# Patient Record
Sex: Male | Born: 1955 | Race: White | Hispanic: No | Marital: Married | Smoking: Former smoker
Health system: Southern US, Community
[De-identification: ages and names within clinical notes are randomized; demographics above are authoritative.]

## PROBLEM LIST (undated history)

## (undated) DIAGNOSIS — C801 Malignant (primary) neoplasm, unspecified: Secondary | ICD-10-CM

## (undated) DIAGNOSIS — M1990 Unspecified osteoarthritis, unspecified site: Secondary | ICD-10-CM

## (undated) DIAGNOSIS — F419 Anxiety disorder, unspecified: Secondary | ICD-10-CM

## (undated) DIAGNOSIS — I10 Essential (primary) hypertension: Secondary | ICD-10-CM

## (undated) DIAGNOSIS — Z8719 Personal history of other diseases of the digestive system: Secondary | ICD-10-CM

## (undated) DIAGNOSIS — R011 Cardiac murmur, unspecified: Secondary | ICD-10-CM

## (undated) DIAGNOSIS — R519 Headache, unspecified: Secondary | ICD-10-CM

## (undated) HISTORY — PX: JOINT REPLACEMENT: SHX530

## (undated) HISTORY — PX: TOTAL HIP REVISION: SHX763

## (undated) HISTORY — PX: TOTAL HIP ARTHROPLASTY: SHX124

---

## 2002-07-09 DIAGNOSIS — I10 Essential (primary) hypertension: Secondary | ICD-10-CM | POA: Insufficient documentation

## 2015-05-08 DIAGNOSIS — R011 Cardiac murmur, unspecified: Secondary | ICD-10-CM | POA: Insufficient documentation

## 2017-09-02 ENCOUNTER — Telehealth: Payer: Self-pay | Admitting: Orthopedic Surgery

## 2017-09-02 NOTE — Telephone Encounter (Signed)
Patient called to inquire about scheduling appointment w/Dr Aline Brochure; states new to the area. Offered appointment.  Patient relays that he has treated with orthopaedist in [place] for this problem.  Discussed requesting of records,films for Dr to review.  Mailed authorization request form, which he will complete and bring in for our office to forward to his previous provider.

## 2017-11-03 ENCOUNTER — Other Ambulatory Visit: Payer: Self-pay

## 2017-11-03 ENCOUNTER — Ambulatory Visit
Admission: RE | Admit: 2017-11-03 | Discharge: 2017-11-03 | Disposition: A | Discharge status: Home / Self Care | Payer: Managed Care, Other (non HMO) | Source: Ambulatory Visit | Attending: Surgery | Admitting: Surgery

## 2017-11-03 ENCOUNTER — Encounter
Admission: RE | Admit: 2017-11-03 | Discharge: 2017-11-03 | Disposition: A | Discharge status: Home / Self Care | Payer: Managed Care, Other (non HMO) | Source: Ambulatory Visit | Attending: Surgery | Admitting: Surgery

## 2017-11-03 DIAGNOSIS — M1612 Unilateral primary osteoarthritis, left hip: Secondary | ICD-10-CM | POA: Insufficient documentation

## 2017-11-03 DIAGNOSIS — Z01812 Encounter for preprocedural laboratory examination: Secondary | ICD-10-CM | POA: Insufficient documentation

## 2017-11-03 DIAGNOSIS — R911 Solitary pulmonary nodule: Secondary | ICD-10-CM | POA: Insufficient documentation

## 2017-11-03 DIAGNOSIS — R9431 Abnormal electrocardiogram [ECG] [EKG]: Secondary | ICD-10-CM | POA: Insufficient documentation

## 2017-11-03 DIAGNOSIS — Z01810 Encounter for preprocedural cardiovascular examination: Secondary | ICD-10-CM | POA: Diagnosis not present

## 2017-11-03 DIAGNOSIS — Z01818 Encounter for other preprocedural examination: Secondary | ICD-10-CM

## 2017-11-03 HISTORY — DX: Cardiac murmur, unspecified: R01.1

## 2017-11-03 HISTORY — DX: Unspecified osteoarthritis, unspecified site: M19.90

## 2017-11-03 HISTORY — DX: Anxiety disorder, unspecified: F41.9

## 2017-11-03 HISTORY — DX: Personal history of other diseases of the digestive system: Z87.19

## 2017-11-03 HISTORY — DX: Essential (primary) hypertension: I10

## 2017-11-03 LAB — CBC
HCT: 42.3 % (ref 40.0–52.0)
HEMOGLOBIN: 14.5 g/dL (ref 13.0–18.0)
MCH: 31.2 pg (ref 26.0–34.0)
MCHC: 34.2 g/dL (ref 32.0–36.0)
MCV: 91.4 fL (ref 80.0–100.0)
Platelets: 211 10*3/uL (ref 150–440)
RBC: 4.63 MIL/uL (ref 4.40–5.90)
RDW: 13.6 % (ref 11.5–14.5)
WBC: 5.2 10*3/uL (ref 3.8–10.6)

## 2017-11-03 LAB — URINALYSIS, COMPLETE (UACMP) WITH MICROSCOPIC
BACTERIA UA: NONE SEEN
Bilirubin Urine: NEGATIVE
Glucose, UA: NEGATIVE mg/dL
Hgb urine dipstick: NEGATIVE
Ketones, ur: NEGATIVE mg/dL
Leukocytes, UA: NEGATIVE
Nitrite: NEGATIVE
PROTEIN: NEGATIVE mg/dL
SQUAMOUS EPITHELIAL / LPF: NONE SEEN
Specific Gravity, Urine: 1.011 (ref 1.005–1.030)
pH: 7 (ref 5.0–8.0)

## 2017-11-03 LAB — BASIC METABOLIC PANEL
Anion gap: 10 (ref 5–15)
BUN: 13 mg/dL (ref 6–20)
CHLORIDE: 105 mmol/L (ref 101–111)
CO2: 26 mmol/L (ref 22–32)
Calcium: 9.1 mg/dL (ref 8.9–10.3)
Creatinine, Ser: 0.77 mg/dL (ref 0.61–1.24)
GFR calc Af Amer: >60 mL/min (ref >60)
GFR calc non Af Amer: >60 mL/min (ref >60)
GLUCOSE: 97 mg/dL (ref 65–99)
POTASSIUM: 3.8 mmol/L (ref 3.5–5.1)
Sodium: 141 mmol/L (ref 135–145)

## 2017-11-03 LAB — SURGICAL PCR SCREEN
MRSA, PCR: NEGATIVE
STAPHYLOCOCCUS AUREUS: NEGATIVE

## 2017-11-03 LAB — TYPE AND SCREEN
ABO/RH(D): A POS
ANTIBODY SCREEN: NEGATIVE

## 2017-11-03 NOTE — Pre-Procedure Instructions (Signed)
AS INSTRUCTED BY DR A PENWARDEN, EKG/REQUEST TO HAVE MEDICAL CLEARANCE FAXED TO DR POGGI. LM FOR TIFFANY. DID NOT SEE PCP IN CARE TEAMS OR RECENT CARDIAC VISIT

## 2017-11-03 NOTE — Patient Instructions (Signed)
Your procedure is scheduled on: 11/18/17 Thur Report to Same Day Surgery 2nd floor medical mall [facility] Entrance-take elevator on left to 2nd floor.  Check in with surgery information desk.) To find out your arrival time please call [phone] between 1PM - 3PM on 11/17/17 Wed  Remember: Instructions that are not followed completely may result in serious medical risk, up to and including death, or upon the discretion of your surgeon and anesthesiologist your surgery may need to be rescheduled.    _x___ 1. Do not eat food after midnight the night before your procedure. You may drink clear liquids up to 2 hours before you are scheduled to arrive at the hospital for your procedure.  Do not drink clear liquids within 2 hours of your scheduled arrival to the hospital.  Clear liquids include  --Water or Apple juice without pulp  --Clear carbohydrate beverage such as ClearFast or Gatorade  --Black Coffee or Clear Tea (No milk, no creamers, do not add anything to                  the coffee or Tea Type 1 and type 2 diabetics should only drink water.  No gum chewing or hard candies.     __x__ 2. No Alcohol for 24 hours before or after surgery.   __x__3. No Smoking or e-cigarettes for 24 prior to surgery.  Do not use any chewable tobacco products for at least 6 hour prior to surgery   ____  4. Bring all medications with you on the day of surgery if instructed.    __x__ 5. Notify your doctor if there is any change in your medical condition     (cold, fever, infections).    x___6. On the morning of surgery brush your teeth with toothpaste and water.  You may rinse your mouth with mouth wash if you wish.  Do not swallow any toothpaste or mouthwash.   Do not wear jewelry, make-up, hairpins, clips or nail polish.  Do not wear lotions, powders, or perfumes. You may wear deodorant.  Do not shave 48 hours prior to surgery. Men may shave face and neck.  Do not bring valuables to the hospital.     [facility] is not responsible for any belongings or valuables.               Contacts, dentures or bridgework may not be worn into surgery.  Leave your suitcase in the car. After surgery it may be brought to your room.  For patients admitted to the hospital, discharge time is determined by your                       treatment team.  _  Patients discharged the day of surgery will not be allowed to drive home.  You will need someone to drive you home and stay with you the night of your procedure.    Please read over the following fact sheets that you were given:   [facility] Preparing for Surgery and or MRSA Information   _x___ Take anti-hypertensive listed below, cardiac, seizure, asthma,     anti-reflux and psychiatric medicines. These include:  1. venlafaxine XR (EFFEXOR-XR) 150 MG 24 hr capsule  2.  3.  4.  5.  6.  ____Fleets enema or Magnesium Citrate as directed.   _x___ Use CHG Soap or sage wipes as directed on instruction sheet   ____ Use inhalers on the day of surgery and  bring to hospital day of surgery  ____ Stop Metformin and Janumet 2 days prior to surgery.    ____ Take 1/2 of usual insulin dose the night before surgery and none on the morning     surgery.   _x___ Follow recommendations from Cardiologist, Pulmonologist or PCP regarding          stopping Aspirin, Coumadin, Plavix ,Eliquis, Effient, or Pradaxa, and Pletal.  X____Stop Anti-inflammatories such as Advil, Aleve, Ibuprofen, Motrin, Naproxen, Naprosyn, Goodies powders or aspirin products. OK to take Tylenol and                          Celebrex.   _x___ Stop supplements until after surgery.  But may continue Vitamin D, Vitamin B,       and multivitamin.   ____ Bring C-Pap to the hospital.

## 2017-11-04 NOTE — Pre-Procedure Instructions (Signed)
FAXED CXR TO DR Roland Rack AND REQUESTED IT BE FORWARDED TO WHOMEVER IS [place]

## 2017-11-08 DIAGNOSIS — F17211 Nicotine dependence, cigarettes, in remission: Secondary | ICD-10-CM | POA: Insufficient documentation

## 2017-11-08 DIAGNOSIS — R9431 Abnormal electrocardiogram [ECG] [EKG]: Secondary | ICD-10-CM | POA: Insufficient documentation

## 2017-11-10 DIAGNOSIS — Z8669 Personal history of other diseases of the nervous system and sense organs: Secondary | ICD-10-CM | POA: Insufficient documentation

## 2017-11-10 DIAGNOSIS — I340 Nonrheumatic mitral (valve) insufficiency: Secondary | ICD-10-CM | POA: Insufficient documentation

## 2017-11-10 DIAGNOSIS — F419 Anxiety disorder, unspecified: Secondary | ICD-10-CM | POA: Insufficient documentation

## 2017-11-10 NOTE — Pre-Procedure Instructions (Signed)
SEEN BY CARDIOLOGIST 11/08/17 AND FOR STRESS/ECHO 11/11/17. SPOKE WITH TIFFANY AT DR [facility] RE CXR FOLLOWUP. [place] PCP OFFICE

## 2017-11-15 NOTE — Pre-Procedure Instructions (Signed)
CLEARED MOD RISK BY DR [facility] 11/11/17 WILL FOLLOWUP WITH CHEST CT POST OP

## 2017-11-17 MED ORDER — CEFAZOLIN SODIUM-DEXTROSE 2-4 GM/100ML-% IV SOLN
2.0000 g | Freq: Once | INTRAVENOUS | Status: AC
  Administered 2017-11-18: 2 g via INTRAVENOUS

## 2017-11-18 ENCOUNTER — Inpatient Hospital Stay
Admission: RE | Admit: 2017-11-18 | Discharge: 2017-11-20 | DRG: 470 | Disposition: A | Discharge status: Home / Self Care | Payer: Managed Care, Other (non HMO) | Source: Ambulatory Visit | Attending: Surgery | Admitting: Surgery

## 2017-11-18 ENCOUNTER — Encounter
Admission: RE | Disposition: A | Discharge status: Home / Self Care | Payer: Self-pay | Source: Ambulatory Visit | Attending: Surgery

## 2017-11-18 ENCOUNTER — Other Ambulatory Visit: Payer: Self-pay

## 2017-11-18 ENCOUNTER — Inpatient Hospital Stay: Payer: Managed Care, Other (non HMO)

## 2017-11-18 ENCOUNTER — Inpatient Hospital Stay: Payer: Managed Care, Other (non HMO) | Admitting: Certified Registered Nurse Anesthetist

## 2017-11-18 ENCOUNTER — Encounter: Payer: Self-pay | Admitting: Anesthesiology

## 2017-11-18 DIAGNOSIS — Z96641 Presence of right artificial hip joint: Secondary | ICD-10-CM | POA: Diagnosis present

## 2017-11-18 DIAGNOSIS — M1612 Unilateral primary osteoarthritis, left hip: Secondary | ICD-10-CM | POA: Diagnosis present

## 2017-11-18 DIAGNOSIS — Z8249 Family history of ischemic heart disease and other diseases of the circulatory system: Secondary | ICD-10-CM

## 2017-11-18 DIAGNOSIS — I10 Essential (primary) hypertension: Secondary | ICD-10-CM | POA: Diagnosis present

## 2017-11-18 DIAGNOSIS — Z79899 Other long term (current) drug therapy: Secondary | ICD-10-CM | POA: Diagnosis not present

## 2017-11-18 DIAGNOSIS — Z96642 Presence of left artificial hip joint: Secondary | ICD-10-CM

## 2017-11-18 DIAGNOSIS — Z23 Encounter for immunization: Secondary | ICD-10-CM

## 2017-11-18 DIAGNOSIS — Z87891 Personal history of nicotine dependence: Secondary | ICD-10-CM | POA: Diagnosis not present

## 2017-11-18 DIAGNOSIS — F419 Anxiety disorder, unspecified: Secondary | ICD-10-CM | POA: Diagnosis present

## 2017-11-18 HISTORY — PX: TOTAL HIP ARTHROPLASTY: SHX124

## 2017-11-18 LAB — TYPE AND SCREEN
ABO/RH(D): A POS
ANTIBODY SCREEN: NEGATIVE

## 2017-11-18 SURGERY — ARTHROPLASTY, HIP, TOTAL,POSTERIOR APPROACH
Anesthesia: Spinal | Laterality: Left

## 2017-11-18 MED ORDER — CEFAZOLIN SODIUM-DEXTROSE 2-4 GM/100ML-% IV SOLN
INTRAVENOUS | Status: AC
  Filled 2017-11-18: qty 100

## 2017-11-18 MED ORDER — PROMETHAZINE HCL 25 MG/ML IJ SOLN: 6.2500 mg | INTRAMUSCULAR | Status: DC | PRN

## 2017-11-18 MED ORDER — OXYCODONE HCL 5 MG PO TABS
5.0000 mg | ORAL_TABLET | ORAL | Status: DC | PRN
  Administered 2017-11-18 (×2): 5 mg via ORAL
  Administered 2017-11-18 – 2017-11-19 (×2): 10 mg via ORAL
  Administered 2017-11-19 (×3): 5 mg via ORAL
  Administered 2017-11-19 – 2017-11-20 (×5): 10 mg via ORAL
  Filled 2017-11-18 (×2): qty 2
  Filled 2017-11-18 (×2): qty 1
  Filled 2017-11-18: qty 2
  Filled 2017-11-18: qty 1
  Filled 2017-11-18: qty 2
  Filled 2017-11-18: qty 1
  Filled 2017-11-18: qty 2
  Filled 2017-11-18: qty 1
  Filled 2017-11-18 (×2): qty 2

## 2017-11-18 MED ORDER — MIDAZOLAM HCL 5 MG/5ML IJ SOLN
INTRAMUSCULAR | Status: DC | PRN
  Administered 2017-11-18: 1 mg via INTRAVENOUS

## 2017-11-18 MED ORDER — NEOMYCIN-POLYMYXIN B GU 40-200000 IR SOLN
Status: AC
  Filled 2017-11-18: qty 20

## 2017-11-18 MED ORDER — ACETAMINOPHEN 325 MG PO TABS
325.0000 mg | ORAL_TABLET | Freq: Four times a day (QID) | ORAL | Status: DC | PRN
  Administered 2017-11-19: 650 mg via ORAL
  Filled 2017-11-18: qty 2

## 2017-11-18 MED ORDER — BUPIVACAINE-EPINEPHRINE (PF) 0.5% -1:200000 IJ SOLN
INTRAMUSCULAR | Status: AC
  Filled 2017-11-18: qty 30

## 2017-11-18 MED ORDER — BISACODYL 10 MG RE SUPP: 10.0000 mg | Freq: Every day | RECTAL | Status: DC | PRN

## 2017-11-18 MED ORDER — CEFAZOLIN SODIUM-DEXTROSE 2-4 GM/100ML-% IV SOLN
2.0000 g | Freq: Four times a day (QID) | INTRAVENOUS | Status: AC
  Administered 2017-11-18 – 2017-11-19 (×3): 2 g via INTRAVENOUS
  Filled 2017-11-18 (×3): qty 100

## 2017-11-18 MED ORDER — DIPHENHYDRAMINE HCL 12.5 MG/5ML PO ELIX: 12.5000 mg | ORAL_SOLUTION | ORAL | Status: DC | PRN

## 2017-11-18 MED ORDER — FENTANYL CITRATE (PF) 100 MCG/2ML IJ SOLN
INTRAMUSCULAR | Status: AC
  Filled 2017-11-18: qty 2

## 2017-11-18 MED ORDER — KETOROLAC TROMETHAMINE 15 MG/ML IJ SOLN
15.0000 mg | Freq: Four times a day (QID) | INTRAMUSCULAR | Status: AC
  Administered 2017-11-18 – 2017-11-19 (×4): 15 mg via INTRAVENOUS
  Filled 2017-11-18 (×4): qty 1

## 2017-11-18 MED ORDER — SCOPOLAMINE 1 MG/3DAYS TD PT72
MEDICATED_PATCH | TRANSDERMAL | Status: AC
  Administered 2017-11-18: 1.5 mg via TRANSDERMAL
  Filled 2017-11-18: qty 1

## 2017-11-18 MED ORDER — ENOXAPARIN SODIUM 40 MG/0.4ML ~~LOC~~ SOLN
40.0000 mg | SUBCUTANEOUS | Status: DC
  Administered 2017-11-19 – 2017-11-20 (×2): 40 mg via SUBCUTANEOUS
  Filled 2017-11-18 (×2): qty 0.4

## 2017-11-18 MED ORDER — METOCLOPRAMIDE HCL 10 MG PO TABS: 5.0000 mg | ORAL_TABLET | Freq: Three times a day (TID) | ORAL | Status: DC | PRN

## 2017-11-18 MED ORDER — SCOPOLAMINE 1 MG/3DAYS TD PT72
1.0000 | MEDICATED_PATCH | TRANSDERMAL | Status: DC
  Administered 2017-11-18: 1.5 mg via TRANSDERMAL

## 2017-11-18 MED ORDER — PHENYLEPHRINE HCL 10 MG/ML IJ SOLN
INTRAMUSCULAR | Status: AC
  Filled 2017-11-18: qty 1

## 2017-11-18 MED ORDER — TRANEXAMIC ACID 1000 MG/10ML IV SOLN
INTRAVENOUS | Status: AC
  Filled 2017-11-18: qty 10

## 2017-11-18 MED ORDER — INFLUENZA VAC SPLIT QUAD 0.5 ML IM SUSY
0.5000 mL | PREFILLED_SYRINGE | INTRAMUSCULAR | Status: AC
  Administered 2017-11-19: 0.5 mL via INTRAMUSCULAR
  Filled 2017-11-18: qty 0.5

## 2017-11-18 MED ORDER — MIDAZOLAM HCL 2 MG/2ML IJ SOLN
INTRAMUSCULAR | Status: AC
  Filled 2017-11-18: qty 2

## 2017-11-18 MED ORDER — BUPIVACAINE HCL (PF) 0.5 % IJ SOLN
INTRAMUSCULAR | Status: DC | PRN
  Administered 2017-11-18: 3 mL

## 2017-11-18 MED ORDER — AMLODIPINE BESYLATE 5 MG PO TABS: 5.0000 mg | ORAL_TABLET | Freq: Every evening | ORAL | Status: DC

## 2017-11-18 MED ORDER — BUPIVACAINE HCL (PF) 0.5 % IJ SOLN
INTRAMUSCULAR | Status: AC
  Filled 2017-11-18: qty 10

## 2017-11-18 MED ORDER — SUMATRIPTAN SUCCINATE 50 MG PO TABS
100.0000 mg | ORAL_TABLET | ORAL | Status: DC | PRN
  Administered 2017-11-18: 100 mg via ORAL
  Filled 2017-11-18 (×2): qty 1
  Filled 2017-11-18: qty 2

## 2017-11-18 MED ORDER — LACTATED RINGERS IV SOLN
INTRAVENOUS | Status: DC
  Administered 2017-11-18 (×2): via INTRAVENOUS

## 2017-11-18 MED ORDER — PROPOFOL 500 MG/50ML IV EMUL
INTRAVENOUS | Status: DC | PRN
  Administered 2017-11-18: 70 ug/kg/min via INTRAVENOUS

## 2017-11-18 MED ORDER — PROPOFOL 500 MG/50ML IV EMUL
INTRAVENOUS | Status: AC
  Filled 2017-11-18: qty 50

## 2017-11-18 MED ORDER — PHENYLEPHRINE HCL 10 MG/ML IJ SOLN
INTRAMUSCULAR | Status: DC | PRN
  Administered 2017-11-18: 100 ug via INTRAVENOUS

## 2017-11-18 MED ORDER — ACETAMINOPHEN 500 MG PO TABS
1000.0000 mg | ORAL_TABLET | Freq: Four times a day (QID) | ORAL | Status: AC
  Administered 2017-11-18 – 2017-11-19 (×4): 1000 mg via ORAL
  Filled 2017-11-18 (×4): qty 2

## 2017-11-18 MED ORDER — ONDANSETRON HCL 4 MG/2ML IJ SOLN: 4.0000 mg | Freq: Four times a day (QID) | INTRAMUSCULAR | Status: DC | PRN

## 2017-11-18 MED ORDER — PROPOFOL 10 MG/ML IV BOLUS
INTRAVENOUS | Status: DC | PRN
  Administered 2017-11-18 (×6): 20 mg via INTRAVENOUS

## 2017-11-18 MED ORDER — VENLAFAXINE HCL ER 150 MG PO CP24
150.0000 mg | ORAL_CAPSULE | Freq: Every day | ORAL | Status: DC
  Administered 2017-11-18 – 2017-11-20 (×3): 150 mg via ORAL
  Filled 2017-11-18 (×3): qty 1

## 2017-11-18 MED ORDER — LOSARTAN POTASSIUM 50 MG PO TABS
50.0000 mg | ORAL_TABLET | Freq: Two times a day (BID) | ORAL | Status: DC
  Administered 2017-11-18 – 2017-11-20 (×2): 50 mg via ORAL
  Filled 2017-11-18 (×2): qty 1

## 2017-11-18 MED ORDER — SODIUM CHLORIDE 0.9 % IV SOLN
INTRAVENOUS | Status: DC | PRN
  Administered 2017-11-18: 30 ug/min via INTRAVENOUS

## 2017-11-18 MED ORDER — METOCLOPRAMIDE HCL 5 MG/ML IJ SOLN: 5.0000 mg | Freq: Three times a day (TID) | INTRAMUSCULAR | Status: DC | PRN

## 2017-11-18 MED ORDER — FENTANYL CITRATE (PF) 100 MCG/2ML IJ SOLN: 25.0000 ug | INTRAMUSCULAR | Status: DC | PRN

## 2017-11-18 MED ORDER — BUPIVACAINE LIPOSOME 1.3 % IJ SUSP
INTRAMUSCULAR | Status: AC
  Filled 2017-11-18: qty 20

## 2017-11-18 MED ORDER — FLEET ENEMA 7-19 GM/118ML RE ENEM: 1.0000 | ENEMA | Freq: Once | RECTAL | Status: DC | PRN

## 2017-11-18 MED ORDER — BUPIVACAINE-EPINEPHRINE (PF) 0.5% -1:200000 IJ SOLN
INTRAMUSCULAR | Status: DC | PRN
  Administered 2017-11-18: 30 mL

## 2017-11-18 MED ORDER — SODIUM CHLORIDE 0.9 % IJ SOLN
INTRAMUSCULAR | Status: AC
  Filled 2017-11-18: qty 50

## 2017-11-18 MED ORDER — MAGNESIUM HYDROXIDE 400 MG/5ML PO SUSP
30.0000 mL | Freq: Every day | ORAL | Status: DC | PRN
  Administered 2017-11-19: 30 mL via ORAL
  Filled 2017-11-18: qty 30

## 2017-11-18 MED ORDER — GLYCOPYRROLATE 0.2 MG/ML IJ SOLN
INTRAMUSCULAR | Status: AC
  Filled 2017-11-18: qty 1

## 2017-11-18 MED ORDER — KCL IN DEXTROSE-NACL 20-5-0.9 MEQ/L-%-% IV SOLN
INTRAVENOUS | Status: DC
  Administered 2017-11-18 (×2): via INTRAVENOUS
  Filled 2017-11-18 (×7): qty 1000

## 2017-11-18 MED ORDER — BUPIVACAINE LIPOSOME 1.3 % IJ SUSP
INTRAMUSCULAR | Status: DC | PRN
  Administered 2017-11-18: 60 mL

## 2017-11-18 MED ORDER — LIDOCAINE HCL (PF) 2 % IJ SOLN
INTRAMUSCULAR | Status: AC
  Filled 2017-11-18: qty 20

## 2017-11-18 MED ORDER — DOCUSATE SODIUM 100 MG PO CAPS
100.0000 mg | ORAL_CAPSULE | Freq: Two times a day (BID) | ORAL | Status: DC
  Administered 2017-11-18 – 2017-11-20 (×3): 100 mg via ORAL
  Filled 2017-11-18 (×3): qty 1

## 2017-11-18 MED ORDER — PANTOPRAZOLE SODIUM 40 MG PO TBEC
40.0000 mg | DELAYED_RELEASE_TABLET | Freq: Every day | ORAL | Status: DC
  Administered 2017-11-18 – 2017-11-20 (×3): 40 mg via ORAL
  Filled 2017-11-18 (×3): qty 1

## 2017-11-18 MED ORDER — ADULT MULTIVITAMIN W/MINERALS CH
1.0000 | ORAL_TABLET | Freq: Every day | ORAL | Status: DC
  Administered 2017-11-18 – 2017-11-20 (×3): 1 via ORAL
  Filled 2017-11-18 (×3): qty 1

## 2017-11-18 MED ORDER — HYDROMORPHONE HCL 1 MG/ML IJ SOLN: 0.5000 mg | INTRAMUSCULAR | Status: DC | PRN

## 2017-11-18 MED ORDER — FENTANYL CITRATE (PF) 100 MCG/2ML IJ SOLN
INTRAMUSCULAR | Status: DC | PRN
  Administered 2017-11-18 (×2): 50 ug via INTRAVENOUS

## 2017-11-18 MED ORDER — NEOMYCIN-POLYMYXIN B GU 40-200000 IR SOLN
Status: DC | PRN
  Administered 2017-11-18: 16 mL

## 2017-11-18 MED ORDER — ONDANSETRON HCL 4 MG PO TABS: 4.0000 mg | ORAL_TABLET | Freq: Four times a day (QID) | ORAL | Status: DC | PRN

## 2017-11-18 MED ORDER — KETOROLAC TROMETHAMINE 30 MG/ML IJ SOLN
INTRAMUSCULAR | Status: AC
  Administered 2017-11-18: 30 mg via INTRAVENOUS
  Filled 2017-11-18: qty 1

## 2017-11-18 MED ORDER — TRANEXAMIC ACID 1000 MG/10ML IV SOLN
INTRAVENOUS | Status: AC | PRN
  Administered 2017-11-18: 1000 mg via TOPICAL

## 2017-11-18 MED ORDER — ACETAMINOPHEN 10 MG/ML IV SOLN
INTRAVENOUS | Status: AC
  Filled 2017-11-18: qty 100

## 2017-11-18 MED ORDER — KETOROLAC TROMETHAMINE 30 MG/ML IJ SOLN
30.0000 mg | Freq: Once | INTRAMUSCULAR | Status: AC
  Administered 2017-11-18: 30 mg via INTRAVENOUS

## 2017-11-18 MED ORDER — ACETAMINOPHEN 10 MG/ML IV SOLN
INTRAVENOUS | Status: DC | PRN
  Administered 2017-11-18: 1000 mg via INTRAVENOUS

## 2017-11-18 SURGICAL SUPPLY — 61 items
BAG DECANTER FOR FLEXI CONT (MISCELLANEOUS) IMPLANT
BLADE SAGITTAL WIDE XTHICK NO (BLADE) ×2 IMPLANT
BLADE SURG SZ20 CARB STEEL (BLADE) ×2 IMPLANT
CANISTER SUCT 1200ML W/VALVE (MISCELLANEOUS) ×2 IMPLANT
CANISTER SUCT 3000ML PPV (MISCELLANEOUS) ×4 IMPLANT
CHLORAPREP W/TINT 26ML (MISCELLANEOUS) ×2 IMPLANT
DRAPE IMP U-DRAPE 54X76 (DRAPES) ×2 IMPLANT
DRAPE INCISE IOBAN 66X60 STRL (DRAPES) ×2 IMPLANT
DRAPE SHEET LG 3/4 BI-LAMINATE (DRAPES) ×2 IMPLANT
DRAPE SURG 17X11 SM STRL (DRAPES) ×4 IMPLANT
DRAPE TABLE BACK 80X90 (DRAPES) ×2 IMPLANT
DRSG OPSITE POSTOP 4X10 (GAUZE/BANDAGES/DRESSINGS) ×2 IMPLANT
DRSG OPSITE POSTOP 4X12 (GAUZE/BANDAGES/DRESSINGS) ×2 IMPLANT
DRSG OPSITE POSTOP 4X14 (GAUZE/BANDAGES/DRESSINGS) ×2 IMPLANT
ELECT BLADE 6.5 EXT (BLADE) ×2 IMPLANT
ELECT CAUTERY BLADE 6.4 (BLADE) ×2 IMPLANT
GAUZE PACK 2X3YD (MISCELLANEOUS) IMPLANT
GLOVE BIO SURGEON STRL SZ7.5 (GLOVE) ×8 IMPLANT
GLOVE BIO SURGEON STRL SZ8 (GLOVE) ×8 IMPLANT
GLOVE BIOGEL PI IND STRL 8 (GLOVE) ×1 IMPLANT
GLOVE BIOGEL PI INDICATOR 8 (GLOVE) ×1
GLOVE INDICATOR 8.0 STRL GRN (GLOVE) ×2 IMPLANT
GOWN STRL REUS W/ TWL LRG LVL3 (GOWN DISPOSABLE) ×1 IMPLANT
GOWN STRL REUS W/ TWL XL LVL3 (GOWN DISPOSABLE) ×1 IMPLANT
GOWN STRL REUS W/TWL LRG LVL3 (GOWN DISPOSABLE) ×1
GOWN STRL REUS W/TWL XL LVL3 (GOWN DISPOSABLE) ×1
HEAD BIOLOX DELTA 36 I +3 HIP (Head) ×2 IMPLANT
HOOD PEEL AWAY FLYTE STAYCOOL (MISCELLANEOUS) ×6 IMPLANT
IV NS 100ML SINGLE PACK (IV SOLUTION) IMPLANT
KIT TURNOVER KIT A (KITS) ×2 IMPLANT
LINER ACETABULAR G7 SZ36 G (Liner) ×2 IMPLANT
NDL SAFETY ECLIPSE 18X1.5 (NEEDLE) ×2 IMPLANT
NEEDLE FILTER BLUNT 18X 1/2SAF (NEEDLE) ×1
NEEDLE FILTER BLUNT 18X1 1/2 (NEEDLE) ×1 IMPLANT
NEEDLE HYPO 18GX1.5 SHARP (NEEDLE) ×2
NEEDLE SPNL 20GX3.5 QUINCKE YW (NEEDLE) ×2 IMPLANT
NS IRRIG 1000ML POUR BTL (IV SOLUTION) ×2 IMPLANT
PACK HIP PROSTHESIS (MISCELLANEOUS) ×2 IMPLANT
PILLOW ABDUC SM (MISCELLANEOUS) ×2 IMPLANT
PIN STEINMANN 3/16X9 BAY 6PK (Pin) ×1 IMPLANT
PULSAVAC PLUS IRRIG FAN TIP (DISPOSABLE) ×2
SHELL ACETABULAR 3H 58MM G7 (Hips) ×2 IMPLANT
SOL .9 NS 3000ML IRR  AL (IV SOLUTION) ×1
SOL .9 NS 3000ML IRR UROMATIC (IV SOLUTION) ×1 IMPLANT
SPONGE LAP 18X18 5 PK (GAUZE/BANDAGES/DRESSINGS) ×2 IMPLANT
ST PIN 3/16X9 BAY 6PK (Pin) ×2 IMPLANT
STAPLER SKIN PROX 35W (STAPLE) ×2 IMPLANT
STEM COLLARLESS FULL 14X150MM (Stem) ×2 IMPLANT
SUT TICRON 2-0 30IN 311381 (SUTURE) ×6 IMPLANT
SUT VIC AB 0 CT1 36 (SUTURE) ×2 IMPLANT
SUT VIC AB 1 CT1 36 (SUTURE) ×4 IMPLANT
SUT VIC AB 2-0 CT1 (SUTURE) ×8 IMPLANT
SYR 10ML LL (SYRINGE) ×2 IMPLANT
SYR 20CC LL (SYRINGE) ×2 IMPLANT
SYR 30ML LL (SYRINGE) ×6 IMPLANT
SYR TB 1ML 27GX1/2 LL (SYRINGE) IMPLANT
TAPE TRANSPORE STRL 2 31045 (GAUZE/BANDAGES/DRESSINGS) ×2 IMPLANT
TIP BRUSH PULSAVAC PLUS 24.33 (MISCELLANEOUS) IMPLANT
TIP FAN IRRIG PULSAVAC PLUS (DISPOSABLE) ×1 IMPLANT
TRAY FOLEY W/METER SILVER 16FR (SET/KITS/TRAYS/PACK) ×2 IMPLANT
WATER STERILE IRR 1000ML POUR (IV SOLUTION) ×2 IMPLANT

## 2017-11-18 NOTE — Evaluation (Signed)
Physical Therapy Evaluation Patient Details Name: Justin Tyler MRN: [id number] DOB: Jul 18, 1956 Today's Date: 11/18/2017   History of Present Illness  admitted after L THR, posterior approach, WBAT (11/18/16).  Clinical Impression  Upon evaluation, patient alert and oriented; follows all commands and demonstrates good effort with all functional activities.  L hip movement mildly limited by post-op pain (2/10) and weakness, though with good muscle activation/control noted throughout.  Able to complete bed mobility with close sup; static sitting balance with sup/mod indep.  Noted onset of dizziness with transition to upright with significant orthostasis noted (see flowsheets for details).  Additional mobility/OOB attempts deferred.  Will continue to assess in subsequent sessions as vitals stabilize. Would benefit from skilled PT to address above deficits and promote optimal return to PLOF; Recommend transition to [place] upon discharge from acute hospitalization.     Follow Up Recommendations Home health PT    Equipment Recommendations  Rolling walker with 5" wheels;3in1 (PT)    Recommendations for Other Services       Precautions / Restrictions Precautions Precautions: Fall;Posterior Hip Restrictions Weight Bearing Restrictions: Yes LLE Weight Bearing: Weight bearing as tolerated      Mobility  Bed Mobility Overal bed mobility: Needs Assistance Bed Mobility: Supine to Sit     Supine to sit: Supervision        Transfers                 General transfer comment: unsafe/unable due to symptomatic orthostasis  Ambulation/Gait             General Gait Details: unsafe/unable due to symptomatic orthostasis  Stairs            Wheelchair Mobility    Modified Rankin (Stroke Patients Only)       Balance Overall balance assessment: Needs assistance Sitting-balance support: No upper extremity supported;Feet supported Sitting balance-Leahy Scale: Good          Standing balance comment: unsafe/unable due to symptomatic orthostasis                             Pertinent Vitals/Pain Pain Assessment: 0-10 Pain Score: 2  Pain Location: L hip Pain Descriptors / Indicators: Guarding;Aching    Home Living Family/patient expects to be discharged to:: Private residence Living Arrangements: Spouse/significant other Available Help at Discharge: Family Type of Home: House Home Access: Stairs to enter Entrance Stairs-Rails: None Entrance Stairs-Number of Steps: 2 Home Layout: Two level;Bed/bath upstairs;Able to live on main level with bedroom/bathroom Home Equipment: Kasandra Knudsen - single point      Prior Function Level of Independence: Independent         Comments: Indep with ADLs, household and community mobilization without assist device; denies fall history.  Working full-time     Journalist, newspaper        Extremity/Trunk Assessment   Upper Extremity Assessment Upper Extremity Assessment: Overall WFL for tasks assessed    Lower Extremity Assessment Lower Extremity Assessment: (L hip grossly 3-/5, knee and ankle WFL; sensation fully intact.  R LE grossly WFL)       Communication   Communication: No difficulties  Cognition Arousal/Alertness: Awake/alert Behavior During Therapy: WFL for tasks assessed/performed Overall Cognitive Status: Within Functional Limits for tasks assessed  General Comments      Exercises Other Exercises Other Exercises: Supine L LE therex, 1x10, AROM for muscular strength/endurnace: ankle pumps, quad sets, SAQs, heel slids, hip abduct/adduct.  Good muscle activation and control, minimal pain reported   Assessment/Plan    PT Assessment Patient needs continued PT services  PT Problem List Decreased strength;Decreased range of motion;Decreased activity tolerance;Decreased balance;Decreased mobility;Decreased knowledge of precautions;Pain        PT Treatment Interventions DME instruction;Gait training;Stair training;Functional mobility training;Therapeutic activities;Therapeutic exercise;Balance training;Patient/family education    PT Goals (Current goals can be found in the Care Plan section)  Acute Rehab PT Goals Patient Stated Goal: to return home with wife PT Goal Formulation: With patient/family Time For Goal Achievement: 12/02/17 Potential to Achieve Goals: Good    Frequency BID   Barriers to discharge        Co-evaluation               AM-PAC PT "6 Clicks" Daily Activity  Outcome Measure Difficulty turning over in bed (including adjusting bedclothes, sheets and blankets)?: A Little Difficulty moving from lying on back to sitting on the side of the bed? : A Little Difficulty sitting down on and standing up from a chair with arms (e.g., wheelchair, bedside commode, etc,.)?: A Little Help needed moving to and from a bed to chair (including a wheelchair)?: A Little Help needed walking in hospital room?: A Little Help needed climbing 3-5 steps with a railing? : A Little 6 Click Score: 18    End of Session   Activity Tolerance: Treatment limited secondary to medical complications (Comment)(symptomatic orthostasis) Patient left: in bed;with bed alarm set;with family/visitor present;with call bell/phone within reach Nurse Communication: Mobility status(orthostasis, response to upright) PT Visit Diagnosis: Difficulty in walking, not elsewhere classified (R26.2);Muscle weakness (generalized) (M62.81);Pain Pain - Right/Left: Left Pain - part of body: Hip    Time: 3419-3790 PT Time Calculation (min) (ACUTE ONLY): 21 min   Charges:   PT Evaluation $PT Eval Moderate Complexity: 1 Mod PT Treatments $Therapeutic Exercise: 8-22 mins   PT G Codes:       Clotile Whittington H. Owens Shark, PT, DPT, NCS 11/18/17, 10:27 PM [phone]

## 2017-11-18 NOTE — Transfer of Care (Signed)
Immediate Anesthesia Transfer of Care Note  Patient: Justin Tyler  Procedure(s) Performed: TOTAL HIP ARTHROPLASTY (Left )  Patient Location: PACU  Anesthesia Type:Spinal  Level of Consciousness: awake, alert , oriented and patient cooperative  Airway & Oxygen Therapy: Patient Spontanous Breathing and Patient connected to nasal cannula oxygen  Post-op Assessment: Report given to RN and Post -op Vital signs reviewed and stable  Post vital signs: Reviewed and stable  Last Vitals:  Vitals:   11/18/17 0608  BP: (!) 120/92  Pulse: 72  Resp: 18  Temp: (!) 35.6 C  SpO2: 98%    Last Pain:  Vitals:   11/18/17 0608  TempSrc: Tympanic         Complications: No apparent anesthesia complications

## 2017-11-18 NOTE — Anesthesia Preprocedure Evaluation (Signed)
Anesthesia Evaluation  Patient identified by MRN, date of birth, ID band Patient awake    Reviewed: Allergy & Precautions, H&P , NPO status , Patient's Chart, lab work & pertinent test results, reviewed documented beta blocker date and time   History of Anesthesia Complications Negative for: history of anesthetic complications  Airway Mallampati: III  TM Distance: >3 FB Neck ROM: full    Dental  (+) Caps, Dental Advidsory Given, Teeth Intact   Pulmonary neg pulmonary ROS, former smoker,           Cardiovascular Exercise Tolerance: Good hypertension, (-) angina(-) CAD, (-) Past MI, (-) Cardiac Stents and (-) CABG (-) dysrhythmias + Valvular Problems/Murmurs      Neuro/Psych PSYCHIATRIC DISORDERS Anxiety negative neurological ROS     GI/Hepatic Neg liver ROS, hiatal hernia, neg GERD  ,  Endo/Other  negative endocrine ROS  Renal/GU negative Renal ROS  negative genitourinary   Musculoskeletal   Abdominal   Peds  Hematology negative hematology ROS (+)   Anesthesia Other Findings Past Medical History: No date: Anxiety No date: Arthritis No date: Heart murmur No date: History of hiatal hernia No date: Hypertension   Reproductive/Obstetrics negative OB ROS                             Anesthesia Physical Anesthesia Plan  ASA: II  Anesthesia Plan: Spinal   Post-op Pain Management:    Induction: Intravenous  PONV Risk Score and Plan: 1 and Propofol infusion  Airway Management Planned: Nasal Cannula  Additional Equipment:   Intra-op Plan:   Post-operative Plan:   Informed Consent: I have reviewed the patients History and Physical, chart, labs and discussed the procedure including the risks, benefits and alternatives for the proposed anesthesia with the patient or authorized representative who has indicated his/her understanding and acceptance.   Dental Advisory Given  Plan  Discussed with: Anesthesiologist, CRNA and Surgeon  Anesthesia Plan Comments:         Anesthesia Quick Evaluation

## 2017-11-18 NOTE — Progress Notes (Signed)
Patient received from surgery doing well. Patient did have an orthostatic episode when trying to get up with PT. Patient was however able to get to bedside commode with assistance of nursing later in the shift. Patient requested to have foley removed. Removed without difficulty. Patient able to dorsiflex and plantar flex. Dressing clean dry and intact. No complaints of nausea.

## 2017-11-18 NOTE — H&P (Signed)
Paper H&P to be scanned into permanent record. H&P reviewed and patient re-examined. No changes. 

## 2017-11-18 NOTE — NC FL2 (Signed)
[place] LEVEL OF CARE SCREENING TOOL     IDENTIFICATION  Patient Name: Justin Tyler Birthdate: [birth year] Sex: male Admission Date (Current Location): 11/18/2017  [place] and [place] Number:  Engineering geologist and Address:  [facility], [address], [place], [place] [address]      Provider Number: [id number]  Attending Physician Name and Address:  Corky Mull, MD  Relative Name and Phone Number:       Current Level of Care: Hospital Recommended Level of Care: [place] Prior Approval Number:    Date Approved/Denied:   PASRR Number: [id number])  Discharge Plan: SNF    Current Diagnoses: Patient Active Problem List   Diagnosis Date Noted  . Status post total hip replacement, left 11/18/2017    Orientation RESPIRATION BLADDER Height & Weight     Self, Time, Situation, Place  Normal Continent Weight: 225 lb (102.1 kg) Height:  6\' 4"  (193 cm)  BEHAVIORAL SYMPTOMS/MOOD NEUROLOGICAL BOWEL NUTRITION STATUS      Continent Diet(Regular Diet )  AMBULATORY STATUS COMMUNICATION OF NEEDS Skin   Extensive Assist Verbally Surgical wounds(Incision: Left Hip. )                       Personal Care Assistance Level of Assistance  Bathing, Feeding, Dressing Bathing Assistance: Limited assistance Feeding assistance: Independent Dressing Assistance: Limited assistance     Functional Limitations Info  Sight, Hearing, Speech Sight Info: Adequate Hearing Info: Adequate Speech Info: Adequate    SPECIAL CARE FACTORS FREQUENCY  PT (By licensed PT), OT (By licensed OT)     PT Frequency: (5) OT Frequency: (5)            Contractures      Additional Factors Info  Code Status, Allergies Code Status Info: (Full Code. ) Allergies Info: (No Known Allergies. )           Current Medications (11/18/2017):  This is the current hospital active medication list Current Facility-Administered Medications   Medication Dose Route Frequency Provider Last Rate Last Dose  . acetaminophen (TYLENOL) tablet 1,000 mg  1,000 mg Oral Q6H Poggi, Marshall Cork, MD   1,000 mg at 11/18/17 1849  . [START ON 11/19/2017] acetaminophen (TYLENOL) tablet 325-650 mg  325-650 mg Oral Q6H PRN Poggi, Marshall Cork, MD      . amLODipine (NORVASC) tablet 5 mg  5 mg Oral QPM Poggi, Marshall Cork, MD      . bisacodyl (DULCOLAX) suppository 10 mg  10 mg Rectal Daily PRN Poggi, Marshall Cork, MD      . ceFAZolin (ANCEF) IVPB 2g/100 mL premix  2 g Intravenous Q6H Poggi, Marshall Cork, MD   Stopped at 11/18/17 1502  . dextrose 5 % and 0.9 % NaCl with KCl 20 mEq/L infusion   Intravenous Continuous Poggi, Marshall Cork, MD 100 mL/hr at 11/18/17 1305    . diphenhydrAMINE (BENADRYL) 12.5 MG/5ML elixir 12.5-25 mg  12.5-25 mg Oral Q4H PRN Poggi, Marshall Cork, MD      . docusate sodium (COLACE) capsule 100 mg  100 mg Oral BID Poggi, Marshall Cork, MD   100 mg at 11/18/17 1434  . [START ON 11/19/2017] enoxaparin (LOVENOX) injection 40 mg  40 mg Subcutaneous Q24H Poggi, Marshall Cork, MD      . HYDROmorphone (DILAUDID) injection 0.5-1 mg  0.5-1 mg Intravenous Q4H PRN Poggi, Marshall Cork, MD      . Derrill Memo ON 11/19/2017] Influenza vac split quadrivalent  PF (FLUARIX) injection 0.5 mL  0.5 mL Intramuscular Tomorrow-1000 Poggi, Marshall Cork, MD      . ketorolac (TORADOL) 15 MG/ML injection 15 mg  15 mg Intravenous Q6H Poggi, Marshall Cork, MD   15 mg at 11/18/17 1849  . losartan (COZAAR) tablet 50 mg  50 mg Oral BID Poggi, Marshall Cork, MD      . magnesium hydroxide (MILK OF MAGNESIA) suspension 30 mL  30 mL Oral Daily PRN Poggi, Marshall Cork, MD      . metoCLOPramide (REGLAN) tablet 5-10 mg  5-10 mg Oral Q8H PRN Poggi, Marshall Cork, MD       Or  . metoCLOPramide (REGLAN) injection 5-10 mg  5-10 mg Intravenous Q8H PRN Poggi, Marshall Cork, MD      . multivitamin with minerals tablet 1 tablet  1 tablet Oral Daily Poggi, Marshall Cork, MD   1 tablet at 11/18/17 1305  . ondansetron (ZOFRAN) tablet 4 mg  4 mg Oral Q6H PRN Poggi, Marshall Cork, MD       Or  .  ondansetron (ZOFRAN) injection 4 mg  4 mg Intravenous Q6H PRN Poggi, Marshall Cork, MD      . oxyCODONE (Oxy IR/ROXICODONE) immediate release tablet 5-10 mg  5-10 mg Oral Q4H PRN Poggi, Marshall Cork, MD   5 mg at 11/18/17 1703  . pantoprazole (PROTONIX) EC tablet 40 mg  40 mg Oral Daily Poggi, Marshall Cork, MD   40 mg at 11/18/17 1305  . scopolamine (TRANSDERM-SCOP) 1 MG/3DAYS 1.5 mg  1 patch Transdermal Q72H [place], Amy, MD   1.5 mg at 11/18/17 0762  . sodium phosphate (FLEET) 7-19 GM/118ML enema 1 enema  1 enema Rectal Once PRN Poggi, Marshall Cork, MD      . SUMAtriptan (IMITREX) tablet 100 mg  100 mg Oral Q2H PRN Poggi, Marshall Cork, MD      . venlafaxine XR (EFFEXOR-XR) 24 hr capsule 150 mg  150 mg Oral Q breakfast Poggi, Marshall Cork, MD   150 mg at 11/18/17 1431     Discharge Medications: Please see discharge summary for a list of discharge medications.  Relevant Imaging Results:  Relevant Lab Results:   Additional Information (SSN: [id number])  Nathyn Luiz, Veronia Beets, LCSW

## 2017-11-18 NOTE — Op Note (Signed)
11/18/2017  10:08 AM  Patient:   Daine Floras  Pre-Op Diagnosis:   Degenerative joint disease, left hip.  Post-Op Diagnosis:   Same.  Procedure:   Left total hip arthroplasty.  Surgeon:   Pascal Lux, MD  Assistant:   Cameron Proud, PA-C  Anesthesia:   Spinal  Findings:   As above.  Complications:   None  EBL:   300 cc  Fluids:   1900 cc crystalloid  UOP:   300 cc  TT:   None  Drains:   None  Closure:   Staples  Implants:   Biomet press-fit system with a #14 laterally offset Echo femoral stem, a 58 mm acetabular shell with an E-poly hi-wall liner, and a 36 mm ceramic head with a +3 mm neck.  Brief Clinical Note:   The patient is a 62 year old male with a long history of progressively worsening left hip pain.  His symptoms have progressed despite medications, activity modification, etc.  His history and examination are consistent with degenerative joint disease confirmed by plain radiographs.  The patient presents at this time for a left total hip arthroplasty.   Procedure:   The patient was brought into the operating room. After adequate spinal anesthesia was obtained, the patient was repositioned in the right lateral decubitus position and secured using a lateral hip positioner. The left hip and lower extremity were prepped with ChloroPrep solution before being draped sterilely. Preoperative antibiotics were administered. A timeout was performed to verify the appropriate surgical site before a standard posterior approach to the hip was made through an approximately 4-5 inch incision. The incision was carried down through the subcutaneous tissues to expose the gluteal fascia and proximal end of the iliotibial band. These structures were split the length of the incision and the Charnley self-retaining hip retractor placed. The bursal tissues were swept posteriorly to expose the short external rotators. The anterior border of the piriformis tendon was identified and this plane  developed down through the capsule to enter the joint. A flap of tissue was elevated off the posterior aspect of the femoral neck and greater trochanter and retracted posteriorly. This flap included the piriformis tendon, the short external rotators, and the posterior capsule. The soft tissues were elevated off the lateral aspect of the ilium and a large Steinmann pin placed bicortically. With the left leg aligned over the right, a drill bit was placed into the greater trochanter parallel to the Steinmann pin and the distance between these two pins measured in order to optimize leg lengths postoperatively. The drill bit was removed and the hip dislocated. The piriformis fossa was debrided of soft tissues before the intramedullary canal was accessed through this point using a triple step reamer. The canal was reamed sequentially beginning with a #7 tapered reamer and progressing to a #14 tapered reamer. This provided excellent circumferential chatter. Using the appropriate guide, a femoral neck cut was made 10-12 mm above the lesser trochanter. The femoral head was removed.  Attention was directed to the acetabular side. The labrum was debrided circumferentially before the ligamentum teres was removed using a large curette. A line was drawn on the drapes corresponding to the native version of the acetabulum. This line was used as a guide while the acetabulum was reamed sequentially beginning with a 50 mm reamer and progressing to a 57 mm reamer. This provided excellent circumferential chatter. The 57 mm trial acetabulum was positioned and found to fit quite well. Therefore, the 58 mm acetabular shell  was selected and impacted into place with care taken to maintain the appropriate version. The trial high wall liner was inserted.  Attention was redirected to the femoral side. A box osteotome was used to establish version before the canal was broached sequentially beginning with a #7 broach and progressing to a #14  broach. This was left in place and several trial reductions performed using both a standard and laterally offset neck options, as well as the 0 mm and +3 mm neck lengths. The permanent E-polyethylene hi-wall liner was impacted into the acetabular shell and its locking mechanism verified using a quarter-inch osteotome. Next, the #14 lateral offset femoral stem was impacted into place with care taken to maintain the appropriate version. A repeat trial reduction was performed using the +3 mm neck length. The +3 mm neck length demonstrated excellent stability both in extension and external rotation as well as with flexion to 90 and internal rotation beyond 70. It also was stable in the position of sleep. In addition, leg lengths appeared to be restored appropriately, both by reassessing the position of the right leg over the left, as well as by measuring the distance between the Steinmann pin and the drill bit. The 36 mm ceramic head with the +3 mm neck adapter construct was put together on the back table before being impacted onto the stem of the femoral component. The Morse taper locking mechanism was verified using manual distraction before the head was relocated and placed through a range of motion with the findings as described above.  The wound was copiously irrigated with bacitracin saline solution via the jet lavage system before the peri-incisional and pericapsular tissues were injected with 30 cc of 0.5% Sensorcaine with epinephrine and 20 cc of Exparel diluted out to 60 cc with normal saline to help with postoperative analgesia. The posterior flap was reapproximated to the posterior aspect of the greater trochanter using #2 Tycron interrupted sutures placed through drill holes. Several additional #2 Tycron interrupted sutures were used to reinforce this layer of closure. The iliotibial band was reapproximated using #1 Vicryl interrupted sutures before the gluteal fascia was closed using a running #1  Vicryl suture. At this point, 1 g of transexemic acid in 10 cc of normal saline was injected into the joint to help reduce postoperative bleeding. The subcutaneous tissues were closed in several layers using 2-0 Vicryl interrupted sutures before the skin was closed using staples. A sterile occlusive dressing was applied to the wound before the patient was placed into an abduction wedge pillow. The patient was then rolled back into the supine position on his/her hospital bed before being awakened and returned to the recovery room in satisfactory condition after tolerating the procedure well.

## 2017-11-18 NOTE — Anesthesia Post-op Follow-up Note (Signed)
Anesthesia QCDR form completed.        

## 2017-11-18 NOTE — Anesthesia Procedure Notes (Signed)
Spinal  Patient location during procedure: OR Start time: 11/18/2017 7:39 AM End time: 11/18/2017 7:44 AM Staffing Anesthesiologist: Martha Clan, MD Resident/CRNA: Bernardo Heater, CRNA Performed: resident/CRNA  Preanesthetic Checklist Completed: patient identified, site marked, surgical consent, pre-op evaluation, timeout performed, IV checked, risks and benefits discussed and monitors and equipment checked Spinal Block Patient position: sitting Prep: ChloraPrep Patient monitoring: heart rate, continuous pulse ox, blood pressure and cardiac monitor Approach: midline Location: L2-3 Injection technique: single-shot Needle Needle type: Whitacre and Introducer  Needle gauge: 24 G Needle length: 9 cm Assessment Sensory level: T10 Additional Notes Negative paresthesia. Negative blood return. Positive free-flowing CSF. Expiration date of kit checked and confirmed. Patient tolerated procedure well, without complications.

## 2017-11-19 LAB — CBC WITH DIFFERENTIAL/PLATELET
Basophils Absolute: 0 K/uL (ref 0–0.1)
Basophils Relative: 1 %
Eosinophils Absolute: 0.1 K/uL (ref 0–0.7)
Eosinophils Relative: 1 %
HCT: 31.8 % — ABNORMAL LOW (ref 40.0–52.0)
Hemoglobin: 10.8 g/dL — ABNORMAL LOW (ref 13.0–18.0)
Lymphocytes Relative: 11 %
Lymphs Abs: 0.8 K/uL — ABNORMAL LOW (ref 1.0–3.6)
MCH: 31.1 pg (ref 26.0–34.0)
MCHC: 34 g/dL (ref 32.0–36.0)
MCV: 91.7 fL (ref 80.0–100.0)
Monocytes Absolute: 1 K/uL (ref 0.2–1.0)
Monocytes Relative: 12 %
Neutro Abs: 6.1 K/uL (ref 1.4–6.5)
Neutrophils Relative %: 75 %
Platelets: 150 K/uL (ref 150–440)
RBC: 3.46 MIL/uL — ABNORMAL LOW (ref 4.40–5.90)
RDW: 13.9 % (ref 11.5–14.5)
WBC: 8 K/uL (ref 3.8–10.6)

## 2017-11-19 LAB — BASIC METABOLIC PANEL
Anion gap: 7 (ref 5–15)
BUN: 15 mg/dL (ref 6–20)
CO2: 24 mmol/L (ref 22–32)
Calcium: 8 mg/dL — ABNORMAL LOW (ref 8.9–10.3)
Chloride: 109 mmol/L (ref 101–111)
Creatinine, Ser: 0.9 mg/dL (ref 0.61–1.24)
GFR calc Af Amer: >60 mL/min (ref >60)
GLUCOSE: 132 mg/dL — AB (ref 65–99)
POTASSIUM: 3.7 mmol/L (ref 3.5–5.1)
Sodium: 140 mmol/L (ref 135–145)

## 2017-11-19 MED ORDER — OXYCODONE HCL 5 MG PO TABS: 5.0000 mg | ORAL_TABLET | ORAL | 0 refills | Status: DC | PRN

## 2017-11-19 MED ORDER — ENOXAPARIN SODIUM 40 MG/0.4ML ~~LOC~~ SOLN: 40.0000 mg | SUBCUTANEOUS | 0 refills | Status: DC

## 2017-11-19 MED ORDER — PNEUMOCOCCAL VAC POLYVALENT 25 MCG/0.5ML IJ INJ
0.5000 mL | INJECTION | INTRAMUSCULAR | Status: AC
  Administered 2017-11-19: 0.5 mL via INTRAMUSCULAR
  Filled 2017-11-19: qty 0.5

## 2017-11-19 NOTE — Discharge Summary (Addendum)
Physician Discharge Summary  Patient ID: Harlie Ragle MRN: [id number] DOB/AGE: May 02, 1956 62 y.o.  Admit date: 11/18/2017 Discharge date: 11/20/2017 Admission Diagnoses:  primary osteoarthritis of left hip   Discharge Diagnoses: Patient Active Problem List   Diagnosis Date Noted  . Status post total hip replacement, left 11/18/2017    Past Medical History:  Diagnosis Date  . Anxiety   . Arthritis   . Heart murmur   . History of hiatal hernia   . Hypertension      Transfusion: none   Consultants (if any):   Discharged Condition: Improved  Hospital Course: Quinntin Malter is an 62 y.o. male who was admitted 11/18/2017 with a diagnosis of left hip osteoarthritis and went to the operating room on 11/18/2017 and underwent the above named procedures.    Surgeries: Procedure(s): TOTAL HIP ARTHROPLASTY on 11/18/2017 Patient tolerated the surgery well. Taken to PACU where she was stabilized and then transferred to the orthopedic floor.  Started on Lovenox 40 mg q 24 hrs. Foot pumps applied bilaterally at 80 mm. Heels elevated on bed with rolled towels. No evidence of DVT. Negative Homan. Physical therapy started on day #1 for gait training and transfer. OT started day #1 for ADL and assisted devices.  Patient's foley was d/c on day #1. Patient's IV  was d/c on day #1.  On post op day #2 patient was stable and ready for discharge to home with outpatient PT.  Implants:  Biomet press-fit system with a #14 laterally offset Echo femoral stem, a 58 mm acetabular shell with an E-poly hi-wall liner, and a 36 mm ceramic head with a +3 mm neck.    He was given perioperative antibiotics:  Anti-infectives (From admission, onward)   Start     Dose/Rate Route Frequency Ordered Stop   11/18/17 1400  ceFAZolin (ANCEF) IVPB 2g/100 mL premix     2 g 200 mL/hr over 30 Minutes Intravenous Every 6 hours 11/18/17 1138 11/19/17 0640   11/18/17 0553  ceFAZolin (ANCEF) 2-4 GM/100ML-% IVPB    Comments:   Norton Blizzard  : cabinet override      11/18/17 0553 11/18/17 0803   11/17/17 2145  ceFAZolin (ANCEF) IVPB 2g/100 mL premix     2 g 200 mL/hr over 30 Minutes Intravenous  Once 11/17/17 2138 11/18/17 0815    .  He was given sequential compression devices, early ambulation, and Lovenox for DVT prophylaxis.  He benefited maximally from the hospital stay and there were no complications.    Recent vital signs:  Vitals:   11/19/17 0445 11/19/17 0758  BP: 117/67 106/69  Pulse: 69 66  Resp: 18 18  Temp: 99.4 F (37.4 C) 98.6 F (37 C)  SpO2: 98% 99%    Recent laboratory studies:  Lab Results  Component Value Date   HGB 10.8 (L) 11/19/2017   HGB 14.5 11/03/2017   Lab Results  Component Value Date   WBC 8.0 11/19/2017   PLT 150 11/19/2017   No results found for: INR Lab Results  Component Value Date   NA 140 11/19/2017   K 3.7 11/19/2017   CL 109 11/19/2017   CO2 24 11/19/2017   BUN 15 11/19/2017   CREATININE 0.90 11/19/2017   GLUCOSE 132 (H) 11/19/2017    Discharge Medications:   Allergies as of 11/19/2017   No Known Allergies     Medication List    STOP taking these medications   ibuprofen 200 MG tablet Commonly known as:  ADVIL,MOTRIN  TAKE these medications   amLODipine 5 MG tablet Commonly known as:  NORVASC Take 5 mg by mouth every evening.   enoxaparin 40 MG/0.4ML injection Commonly known as:  LOVENOX Inject 0.4 mLs (40 mg total) into the skin daily. Start taking on:  11/20/2017   losartan 100 MG tablet Commonly known as:  COZAAR Take 50 mg by mouth 2 (two) times daily.   MULTIVITAMIN GUMMIES ADULT Chew Chew 1 tablet by mouth daily.   oxyCODONE 5 MG immediate release tablet Commonly known as:  Oxy IR/ROXICODONE Take 1-2 tablets (5-10 mg total) by mouth every 4 (four) hours as needed for moderate pain (pain score 4-6).   SUMAtriptan 100 MG tablet Commonly known as:  IMITREX Take 100 mg by mouth every 2 (two) hours as needed for  migraine. May repeat in 2 hours if headache persists or recurs.   venlafaxine XR 150 MG 24 hr capsule Commonly known as:  EFFEXOR-XR Take 150 mg by mouth daily with breakfast.            Durable Medical Equipment  (From admission, onward)        Start     Ordered   11/18/17 1139  DME Bedside commode  Once    Question:  Patient needs a bedside commode to treat with the following condition  Answer:  Status post total hip replacement, left   11/18/17 1138   11/18/17 1139  DME 3 n 1  Once     11/18/17 1138   11/18/17 1139  DME Walker rolling  Once    Question:  Patient needs a walker to treat with the following condition  Answer:  Status post total hip replacement, left   11/18/17 1138      Diagnostic Studies: Dg Chest 2 View  Result Date: 11/03/2017 CLINICAL DATA:  Preoperative evaluation for upcoming hip surgery EXAM: CHEST  2 VIEW COMPARISON:  None. FINDINGS: Cardiac shadow is within normal limits. The lungs are well aerated bilaterally. No focal infiltrate or sizable effusion is noted. Tiny nodular density is noted over the posterior aspect of the right sixth rib. IMPRESSION: Tiny nodular density as described on the right. Non-contrast CT may be helpful for further evaluation. Electronically Signed   By: Inez Catalina M.D.   On: 11/03/2017 14:36   Dg Hip Unilat W Or W/o Pelvis 2-3 Views Left  Result Date: 11/18/2017 CLINICAL DATA:  Status post LEFT total hip arthroplasty. EXAM: DG HIP (WITH OR WITHOUT PELVIS) 2-3V LEFT COMPARISON:  None. FINDINGS: LEFT total hip arthroplasty with overlying surgical staples identified. There is no evidence of dislocation. No complicating features are identified. RIGHT total hip arthroplasty also identified. Foley catheter noted with tip overlying the penile urethra. IMPRESSION: LEFT total hip arthroplasty without complicating features. Foley catheter tip overlying the penile urethra. Electronically Signed   By: Margarette Canada M.D.   On: 11/18/2017 10:43     Disposition: Final discharge disposition not confirmed    Follow-up Information    Poggi, Marshall Cork, MD Follow up in 2 week(s).   Specialty:  Surgery Contact information: [place] [address] [phone]            Signed: Dorise Hiss [facility] 11/19/2017, 12:03 PM

## 2017-11-19 NOTE — Care Management Note (Addendum)
Case Management Note  Patient Details  Name: Justin Tyler MRN: [id number] Date of Birth: 16-Nov-1955  Subjective/Objective:   Met with patient and his wife at bedside to discuss discharge planning. Wife will be caregiver at discharge. PT recommending outpatient PT. They prefer to use [facility] in [place], [place] [phone]).  Appointment schedule for Monday, Feb. 11 @ 3 pm. Pharmacy[facility]: [place]: [phone]). Called Lovenox 40 mg # 14 no refills. Ordered walker and bsc from [facility]. Left message with [facility] case manager, [place] [phone], ext. [phone]) regarding home equipment. Updated wife and patient on POC.                 Action/Plan: OP PT, DME to be delivered today, Lovenox called in  Rachelle Hora, [place] came and gave wife prescription for outpatient PT at 12:00 pm  Expected Discharge Date:                  Expected Discharge Plan:  OP Rehab  In-House Referral:     Discharge planning Services  CM Consult  Post Acute Care Choice:  Durable Medical Equipment Choice offered to:  Patient  DME Arranged:  Bedside commode, Walker rolling DME Agency:  [place]:    [facility] Agency:     Status of Service:  In process, will continue to follow  If discussed at Long Length of Stay Meetings, dates discussed:    Additional Comments:  Jolly Mango, RN 11/19/2017, 11:02 AM

## 2017-11-19 NOTE — Progress Notes (Addendum)
   Subjective: 1 Day Post-Op Procedure(s) (LRB): TOTAL HIP ARTHROPLASTY (Left) Patient reports pain as mild.   Patient is well, and has had no acute complaints or problems Denies any CP, SOB, ABD pain. Passing gas We will continue therapy today.    Objective: Vital signs in last 24 hours: Temp:  [97.6 F (36.4 C)-100.8 F (38.2 C)] 98.6 F (37 C) (03/08 0758) Pulse Rate:  [50-82] 66 (03/08 0758) Resp:  [9-19] 18 (03/08 0758) BP: (86-117)/(55-79) 106/69 (03/08 0758) SpO2:  [97 %-100 %] 99 % (03/08 0758)  Intake/Output from previous day: 03/07 0701 - 03/08 0700 In: 2500 [I.V.:2500] Out: 2440 [Urine:2140; Blood:300] Intake/Output this shift: Total I/O In: -  Out: 300 [Urine:300]  Recent Labs    11/19/17 0535  HGB 10.8*   Recent Labs    11/19/17 0535  WBC 8.0  RBC 3.46*  HCT 31.8*  PLT 150   Recent Labs    11/19/17 0535  NA 140  K 3.7  CL 109  CO2 24  BUN 15  CREATININE 0.90  GLUCOSE 132*  CALCIUM 8.0*   No results for input(s): LABPT, INR in the last 72 hours.  EXAM General - Patient is Alert, Appropriate and Oriented Extremity - Neurovascular intact Sensation intact distally Intact pulses distally Dorsiflexion/Plantar flexion intact No cellulitis present Compartment soft Dressing - dressing C/D/I and scant drainage Motor Function - intact, moving foot and toes well on exam.   Past Medical History:  Diagnosis Date  . Anxiety   . Arthritis   . Heart murmur   . History of hiatal hernia   . Hypertension     Assessment/Plan:   1 Day Post-Op Procedure(s) (LRB): TOTAL HIP ARTHROPLASTY (Left) Active Problems:   Status post total hip replacement, left  Estimated body mass index is 27.39 kg/m as calculated from the following:   Height as of this encounter: 6\' 4"  (1.93 m).   Weight as of this encounter: 102.1 kg (225 lb). Advance diet Up with therapy  Pain well controlled.  VSS Discharge home with outpatient PT tomorrow  DVT Prophylaxis  - Lovenox, Foot Pumps and TED hose Weight-Bearing as tolerated to left leg   T. Rachelle Hora, PA-C [place] 11/19/2017, 9:57 AM

## 2017-11-19 NOTE — Progress Notes (Signed)
Physical Therapy Treatment Patient Details Name: Justin Tyler MRN: [id number] DOB: Nov 09, 1955 Today's Date: 11/19/2017    History of Present Illness admitted after L THR, posterior approach, WBAT (11/18/16).    PT Comments    Noted improvement in BP control and tolerance to upright this date (no subjective/objective signs of orthostasis).  Able to initiate gait training, completing full lap around nursing station with RW, no greater than cga/min assist from therapist. Min cuing for L LE stance time throughout gait cycle, but gait symmetry, fluidity and overall confidence improved as distance increased. Patient very motivated, engaged throughout session; plan to initiate stair training and issue HEP this PM.   Follow Up Recommendations  Outpatient PT     Equipment Recommendations  Rolling walker with 5" wheels;3in1 (PT)    Recommendations for Other Services       Precautions / Restrictions Precautions Precautions: Fall;Posterior Hip Restrictions Weight Bearing Restrictions: Yes LLE Weight Bearing: Weight bearing as tolerated    Mobility  Bed Mobility Overal bed mobility: Needs Assistance Bed Mobility: Supine to Sit     Supine to sit: Supervision     General bed mobility comments: cuing for L LE position/alignment  Transfers Overall transfer level: Needs assistance Equipment used: Rolling walker (2 wheeled) Transfers: Sit to/from Stand Sit to Stand: Min guard;Min assist         General transfer comment: cuing for hand placement, technique and THPs  Ambulation/Gait Ambulation/Gait assistance: Min guard Ambulation Distance (Feet): 220 Feet Assistive device: Rolling walker (2 wheeled)   Gait velocity: 10' walk time, 9-10 seconds   General Gait Details: reciprocal stepping pattern; guarded cadence, improving with distance.  Min cuing for L LE stance time and symmetrical WBing bilat LEs as able.   Stairs            Wheelchair Mobility    Modified Rankin  (Stroke Patients Only)       Balance Overall balance assessment: Needs assistance Sitting-balance support: No upper extremity supported;Feet supported Sitting balance-Leahy Scale: Good     Standing balance support: Bilateral upper extremity supported Standing balance-Leahy Scale: Fair                              Cognition Arousal/Alertness: Awake/alert Behavior During Therapy: WFL for tasks assessed/performed Overall Cognitive Status: Within Functional Limits for tasks assessed                                        Exercises Other Exercises Other Exercises: Seated LE therex, ankle pumps, LAQs x12 AROM for muscular strength/endurance. Will plan to progress towards standing therex this PM Other Exercises: Educated in THPs and functional implications; patient voiced understanding. Other Exercises: Reviewed use of TTB and set up in home environment; patient voiced understanding.    General Comments        Pertinent Vitals/Pain Pain Assessment: 0-10 Pain Score: 2  Pain Location: L hip Pain Descriptors / Indicators: Guarding;Aching Pain Intervention(s): Limited activity within patient's tolerance;Monitored during session;Repositioned    Home Living                      Prior Function            PT Goals (current goals can now be found in the care plan section) Acute Rehab PT Goals Patient Stated Goal: to return home  with wife PT Goal Formulation: With patient/family Time For Goal Achievement: 12/02/17 Potential to Achieve Goals: Good Progress towards PT goals: Progressing toward goals    Frequency    BID      PT Plan Current plan remains appropriate    Co-evaluation              AM-PAC PT "6 Clicks" Daily Activity  Outcome Measure  Difficulty turning over in bed (including adjusting bedclothes, sheets and blankets)?: A Little Difficulty moving from lying on back to sitting on the side of the bed? : A  Little Difficulty sitting down on and standing up from a chair with arms (e.g., wheelchair, bedside commode, etc,.)?: A Little Help needed moving to and from a bed to chair (including a wheelchair)?: A Little Help needed walking in hospital room?: A Little Help needed climbing 3-5 steps with a railing? : A Little 6 Click Score: 18    End of Session Equipment Utilized During Treatment: Gait belt Activity Tolerance: Patient tolerated treatment well Patient left: in chair;with call bell/phone within reach;with chair alarm set Nurse Communication: Mobility status PT Visit Diagnosis: Difficulty in walking, not elsewhere classified (R26.2);Muscle weakness (generalized) (M62.81);Pain Pain - Right/Left: Right Pain - part of body: Hip     Time: 2119-4174 PT Time Calculation (min) (ACUTE ONLY): 29 min  Charges:  $Gait Training: 8-22 mins $Therapeutic Activity: 8-22 mins                    G Codes:       Hyder Deman H. Owens Shark, PT, DPT, NCS 11/19/17, 9:45 AM [phone]

## 2017-11-19 NOTE — Discharge Instructions (Signed)
POSTERIOR TOTAL HIP REPLACEMENT POSTOPERATIVE DIRECTIONS  Hip Rehabilitation, Guidelines Following Surgery  The results of a hip operation are greatly improved after range of motion and muscle strengthening exercises. Follow all safety measures which are given to protect your hip. If any of these exercises cause increased pain or swelling in your joint, decrease the amount until you are comfortable again. Then slowly increase the exercises. Call your caregiver if you have problems or questions.   HOME CARE INSTRUCTIONS  Remove items at home which could result in a fall. This includes throw rugs or furniture in walking pathways.   ICE to the affected hip every three hours for 30 minutes at a time and then as needed for pain and swelling.  Continue to use ice on the hip for pain and swelling from surgery. You may notice swelling that will progress down to the foot and ankle.  This is normal after surgery.  Elevate the leg when you are not up walking on it.    Continue to use the breathing machine which will help keep your temperature down.  It is common for your temperature to cycle up and down following surgery, especially at night when you are not up moving around and exerting yourself.  The breathing machine keeps your lungs expanded and your temperature down.  DIET You may resume your previous home diet once your are discharged from the hospital.  DRESSING / WOUND CARE / SHOWERING You may start showering once staples have been removed. Change dressing as needed.    ACTIVITY Walk with your walker as instructed. Use walker as long as suggested by your caregivers. Avoid periods of inactivity such as sitting longer than an hour when not asleep. This helps prevent blood clots.  You may resume a sexual relationship in one month or when given the OK by your doctor.  You may return to work once you are cleared by your doctor.  Do not drive a car for 6 weeks or until released by you surgeon.    Do not drive while taking narcotics.  WEIGHT BEARING Weight bearing as tolerated  POSTOPERATIVE CONSTIPATION PROTOCOL Constipation - defined medically as fewer than three stools per week and severe constipation as less than one stool per week.  One of the most common issues patients have following surgery is constipation.  Even if you have a regular bowel pattern at home, your normal regimen is likely to be disrupted due to multiple reasons following surgery.  Combination of anesthesia, postoperative narcotics, change in appetite and fluid intake all can affect your bowels.  In order to avoid complications following surgery, here are some recommendations in order to help you during your recovery period.  Colace (docusate) - Pick up an over-the-counter form of Colace or another stool softener and take twice a day as long as you are requiring postoperative pain medications.  Take with a full glass of water daily.  If you experience loose stools or diarrhea, hold the colace until you stool forms back up.  If your symptoms do not get better within 1 week or if they get worse, check with your doctor.  Dulcolax (bisacodyl) - Pick up over-the-counter and take as directed by the product packaging as needed to assist with the movement of your bowels.  Take with a full glass of water.  Use this product as needed if not relieved by Colace only.   MiraLax (polyethylene glycol) - Pick up over-the-counter to have on hand.  MiraLax is a  solution that will increase the amount of water in your bowels to assist with bowel movements.  Take as directed and can mix with a glass of water, juice, soda, coffee, or tea.  Take if you go more than two days without a movement. Do not use MiraLax more than once per day. Call your doctor if you are still constipated or irregular after using this medication for 7 days in a row.  If you continue to have problems with postoperative constipation, please contact the office for  further assistance and recommendations.  If you experience "the worst abdominal pain ever" or develop nausea or vomiting, please contact the office immediatly for further recommendations for treatment.  ITCHING  If you experience itching with your medications, try taking only a single pain pill, or even half a pain pill at a time.  You can also use Benadryl over the counter for itching or also to help with sleep.   TED HOSE STOCKINGS Wear the elastic stockings on both legs for six weeks following surgery during the day but you may remove then at night for sleeping.  MEDICATIONS See your medication summary on the After Visit Summary that the nursing staff will review with you prior to discharge.  You may have some home medications which will be placed on hold until you complete the course of blood thinner medication.  It is important for you to complete the blood thinner medication as prescribed by your surgeon.  Continue your approved medications as instructed at time of discharge.  PRECAUTIONS If you experience chest pain or shortness of breath - call 911 immediately for transfer to the hospital emergency department.  If you develop a fever greater that 101 F, purulent drainage from wound, increased redness or drainage from wound, foul odor from the wound/dressing, or calf pain - CONTACT YOUR SURGEON.                                                   FOLLOW-UP APPOINTMENTS Make sure you keep all of your appointments after your operation with your surgeon and caregivers. You should call the office at the above phone number and make an appointment for approximately two weeks after the date of your surgery or on the date instructed by your surgeon outlined in the "After Visit Summary".  RANGE OF MOTION AND STRENGTHENING EXERCISES  These exercises are designed to help you keep full movement of your hip joint. Follow your caregiver's or physical therapist's instructions. Perform all exercises about  fifteen times, three times per day or as directed. Exercise both hips, even if you have had only one joint replacement. These exercises can be done on a training (exercise) mat, on the floor, on a table or on a bed. Use whatever works the best and is most comfortable for you. Use music or television while you are exercising so that the exercises are a pleasant break in your day. This will make your life better with the exercises acting as a break in routine you can look forward to.  Lying on your back, slowly slide your foot toward your buttocks, raising your knee up off the floor. Then slowly slide your foot back down until your leg is straight again.  Lying on your back spread your legs as far apart as you can without causing discomfort.  Lying on your  side, raise your upper leg and foot straight up from the floor as far as is comfortable. Slowly lower the leg and repeat.  Lying on your back, tighten up the muscle in the front of your thigh (quadriceps muscles). You can do this by keeping your leg straight and trying to raise your heel off the floor. This helps strengthen the largest muscle supporting your knee.  Lying on your back, tighten up the muscles of your buttocks both with the legs straight and with the knee bent at a comfortable angle while keeping your heel on the floor.      IF YOU ARE TRANSFERRED TO A SKILLED REHAB FACILITY If the patient is transferred to a skilled rehab facility following release from the hospital, a list of the current medications will be sent to the facility for the patient to continue.  When discharged from the skilled rehab facility, please have the facility set up the patient's [place] prior to being released. Also, the skilled facility will be responsible for providing the patient with their medications at time of release from the facility to include their pain medication, the muscle relaxants, and their blood thinner medication. If the patient  is still at the rehab facility at time of the two week follow up appointment, the skilled rehab facility will also need to assist the patient in arranging follow up appointment in our office and any transportation needs.  MAKE SURE YOU:  Understand these instructions.  Get help right away if you are not doing well or get worse.    Pick up stool softner and laxative for home use following surgery while on pain medications. Continue to use ice for pain and swelling after surgery. Do not use any lotions or creams on the incision until instructed by your surgeon.

## 2017-11-19 NOTE — Anesthesia Postprocedure Evaluation (Signed)
Anesthesia Post Note  Patient: Justin Tyler  Procedure(s) Performed: TOTAL HIP ARTHROPLASTY (Left )  Patient location during evaluation: Nursing Unit Anesthesia Type: Spinal Level of consciousness: awake, awake and alert and oriented Pain management: pain level controlled Vital Signs Assessment: post-procedure vital signs reviewed and stable Respiratory status: spontaneous breathing and nonlabored ventilation Cardiovascular status: blood pressure returned to baseline and stable Postop Assessment: no headache and no backache Anesthetic complications: no     Last Vitals:  Vitals:   11/18/17 2342 11/19/17 0445  BP: 116/64 117/67  Pulse: 78 69  Resp: 18 18  Temp: 37.4 C 37.4 C  SpO2: 98% 98%    Last Pain:  Vitals:   11/19/17 0600  TempSrc:   PainSc: 2                  Proofreader

## 2017-11-19 NOTE — Progress Notes (Signed)
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    PT Comments    Initiated stair training this PM, requiring min assist +1-2 for optimal safety (esp during trial with bilat UEs on single L rail).  Constant cuing for sequencing/technqiue and L TKE in modified SLS; intermittent manual cuing L knee for full control/activation.  Wife present to observe and voiced understanding of technique; would like to practice with patient herself as appropriate next session (has full flight to access bed/bathroom in home, L ascending rail). Did encourage placement of chair at top of stairs for rest position as stairs completed; patient/wife voiced understanding. Mild confusion, processing delay noted this PM; continue to monitor (mental status, vitals as needed) throughout subsequent sessions.     Follow Up Recommendations  Outpatient PT     Equipment Recommendations  Rolling walker with 5" wheels;3in1 (PT)    Recommendations for Other Services       Precautions / Restrictions Precautions Precautions: Fall;Posterior Hip Precaution Booklet Issued: Yes (comment) Restrictions Weight Bearing Restrictions: Yes LLE Weight Bearing: Weight bearing as tolerated    Mobility  Bed Mobility Overal bed mobility: Needs Assistance Bed Mobility: Supine to Sit     Supine to sit: Min guard     General bed mobility comments: transition towards L side of bed to simulate home environment  Transfers Overall transfer level: Needs assistance Equipment used: Rolling walker (2 wheeled) Transfers: Sit to/from Stand Sit to Stand: Min guard;Min assist         General transfer comment: encouraged to place L LE anterior to BOS (to increase hip angle) with transitions; cuing for hand placement and mechanics.  Tends to move to/from very edge of seating  surfaces, min cuing for awarenses to avoid scooting too far forward  Ambulation/Gait Ambulation/Gait assistance: Min guard Ambulation Distance (Feet): 150 Feet Assistive device: Rolling walker (2 wheeled)       General Gait Details: reciprocal stepping pattern; improved comfort/confidence in use of L LE     Stairs Stairs: Yes   Stair Management: Two rails;One rail Left Number of Stairs: 8(4x1) General stair comments: up/down 4 with bilat rails, cga/min assist; up/down 4 with bilat UEs on single rail, min assist +1; up/down with L rail and R HHA, min assist +1.  Step by step cuing for L knee TKE/stability throughout task; decreasing control with fatigue.  Wheelchair Mobility    Modified Rankin (Stroke Patients Only)       Balance Overall balance assessment: Needs assistance Sitting-balance support: No upper extremity supported;Feet supported Sitting balance-Leahy Scale: Good     Standing balance support: Bilateral upper extremity supported Standing balance-Leahy Scale: Fair                              Cognition Arousal/Alertness: Awake/alert Behavior During Therapy: WFL for tasks assessed/performed Overall Cognitive Status: Within Functional Limits for tasks assessed                                 General Comments: mild confusion noted this date      Exercises Other Exercises Other Exercises: Standing LE therex, 1x10, AROM with RW: heel raises, mini squats, marching.  Good L hip/knee extension in closed-chain position. Other Exercises: Sit/stand x5 with RW, cga/min  assist with emphasis on hand, L LE placement and overall mechanics; encouraged to sit back onto bed surface with descent to increase surface area of buttocks on bed for optimal safety. Other Exercises: Issued handout with written/pictorial descriptions of supine therex; patient/wife voiced understanding of therex and performance.    General Comments        Pertinent  Vitals/Pain Pain Assessment: 0-10 Pain Score: 1  Pain Location: L hip Pain Descriptors / Indicators: Guarding;Aching Pain Intervention(s): Limited activity within patient's tolerance;Monitored during session;Repositioned    Home Living                      Prior Function            PT Goals (current goals can now be found in the care plan section) Acute Rehab PT Goals Patient Stated Goal: to return home with wife PT Goal Formulation: With patient/family Time For Goal Achievement: 12/02/17 Potential to Achieve Goals: Good Progress towards PT goals: Progressing toward goals    Frequency    BID      PT Plan Current plan remains appropriate    Co-evaluation              AM-PAC PT "6 Clicks" Daily Activity  Outcome Measure  Difficulty turning over in bed (including adjusting bedclothes, sheets and blankets)?: A Little Difficulty moving from lying on back to sitting on the side of the bed? : A Little Difficulty sitting down on and standing up from a chair with arms (e.g., wheelchair, bedside commode, etc,.)?: A Little Help needed moving to and from a bed to chair (including a wheelchair)?: A Little Help needed walking in hospital room?: A Little Help needed climbing 3-5 steps with a railing? : A Little 6 Click Score: 18    End of Session Equipment Utilized During Treatment: Gait belt Activity Tolerance: Patient tolerated treatment well Patient left: in chair;with call bell/phone within reach;with chair alarm set;with family/visitor present Nurse Communication: Mobility status PT Visit Diagnosis: Difficulty in walking, not elsewhere classified (R26.2);Muscle weakness (generalized) (M62.81);Pain Pain - Right/Left: Right Pain - part of body: Hip     Time: 1459-1540 PT Time Calculation (min) (ACUTE ONLY): 41 min  Charges:  $Gait Training: 8-22 mins $Therapeutic Exercise: 8-22 mins $Therapeutic Activity: 8-22 mins                    G Codes:        Berkleigh Beckles H. Owens Shark, PT, DPT, NCS 11/19/17, 4:58 PM [phone]

## 2017-11-19 NOTE — Progress Notes (Signed)
Clinical Social Worker (CSW) received SNF consult. PT is recommending outpatient PT. RN case manager aware of above. Please reconsult if future social work needs arise. CSW signing off.   Clovia Reine, LCSW [phone]  

## 2017-11-20 LAB — BASIC METABOLIC PANEL
ANION GAP: 7 (ref 5–15)
BUN: 14 mg/dL (ref 6–20)
CALCIUM: 7.7 mg/dL — AB (ref 8.9–10.3)
CO2: 26 mmol/L (ref 22–32)
Chloride: 108 mmol/L (ref 101–111)
Creatinine, Ser: 0.81 mg/dL (ref 0.61–1.24)
GFR calc Af Amer: >60 mL/min (ref >60)
GFR calc non Af Amer: >60 mL/min (ref >60)
GLUCOSE: 112 mg/dL — AB (ref 65–99)
POTASSIUM: 3.5 mmol/L (ref 3.5–5.1)
Sodium: 141 mmol/L (ref 135–145)

## 2017-11-20 MED ORDER — LACTULOSE 10 GM/15ML PO SOLN
10.0000 g | Freq: Two times a day (BID) | ORAL | Status: DC | PRN
  Administered 2017-11-20: 10 g via ORAL
  Filled 2017-11-20: qty 30

## 2017-11-20 NOTE — Progress Notes (Signed)
Patient and Wife educated on [place] hose application. Wife applied Right ted hose properly after education and demonstration.  Dorna Bloom, SN [facility]

## 2017-11-20 NOTE — Progress Notes (Signed)
Discharge and medication instructions given to patient and Wife. Prescriptions given to patient. Both denied further questions. VS stable. Pt discharging home via wife.  Dorna Bloom, SN [facility]

## 2017-11-20 NOTE — Progress Notes (Signed)
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    PT Comments    Pt agreeable to PT; reports 4/10 L hip pain. Pt continues guarded with movement/mobility on L. Requires Min A for mobilizing LLE out of bed. Education/cues for proper LLE placement with STS transfers. Further education provided to pt/spouse for functional application of posterior hip precautions with seated/standing activity. Increased time on stair training with pt/spouse; improved understanding/confidence. Pt/spouse do note that pt can sleep (and plans to sleep) downstairs for at least the first few nights. Pt has full set of steps to bed/bath in home and 6 steps was taxing this date. Discharge recommendation HHPT to build strength/endurance and promote safety (posterior hip precautions) avoiding excessive car transfers/sittingprior to leaving home several days a week for outpatient.    Follow Up Recommendations  Home health PT     Equipment Recommendations  Rolling walker with 5" wheels;3in1 (PT)    Recommendations for Other Services       Precautions / Restrictions Precautions Precautions: Fall;Posterior Hip Precaution Comments: Remembers 3/3 precautions; good demonstration with min cues Restrictions Weight Bearing Restrictions: Yes LLE Weight Bearing: Weight bearing as tolerated    Mobility  Bed Mobility Overal bed mobility: Needs Assistance Bed Mobility: Supine to Sit     Supine to sit: Min assist     General bed mobility comments: for RLE; cues for bridging to edge of bed first  Transfers Overall transfer level: Needs assistance Equipment used: Rolling walker (2 wheeled) Transfers: Sit to/from Stand Sit to Stand: Min guard         General transfer comment: Min cues for reminder of RLE placement with sit and scooting back into  bed  Ambulation/Gait Ambulation/Gait assistance: Min guard Ambulation Distance (Feet): 80 Feet Assistive device: Rolling walker (2 wheeled) Gait Pattern/deviations: Step-through pattern;Decreased step length - right;Decreased weight shift to left Gait velocity: decreased Gait velocity interpretation: Below normal speed for age/gender General Gait Details: cues for improved heel strike on L; good correction.    Stairs Stairs: Yes   Stair Management: One rail Left;Sideways;Step to pattern Number of Stairs: 6 General stair comments: Increased time/education with pt and spouse; spouse performing  main assist with education. Focus on improved step up/down close to edge then readjusting feet as needed before taking on next step for improved strength/stability and safety  Wheelchair Mobility    Modified Rankin (Stroke Patients Only)       Balance Overall balance assessment: Needs assistance Sitting-balance support: Feet supported Sitting balance-Leahy Scale: Good     Standing balance support: Bilateral upper extremity supported Standing balance-Leahy Scale: Fair                              Cognition Arousal/Alertness: Awake/alert Behavior During Therapy: WFL for tasks assessed/performed Overall Cognitive Status: Within Functional Limits for tasks assessed                                        Exercises Other Exercises Other Exercises: Education on posterior precautions in functional application with home activity    General Comments        Pertinent Vitals/Pain Pain Assessment: 0-10 Pain Score: 4  Pain Location: L hip Pain Descriptors / Indicators:  Guarding;Discomfort Pain Intervention(s): Limited activity within patient's tolerance;Monitored during session    Home Living                      Prior Function            PT Goals (current goals can now be found in the care plan section) Progress towards PT goals:  Progressing toward goals    Frequency    BID      PT Plan Discharge plan needs to be updated    Co-evaluation              AM-PAC PT "6 Clicks" Daily Activity  Outcome Measure  Difficulty turning over in bed (including adjusting bedclothes, sheets and blankets)?: A Little Difficulty moving from lying on back to sitting on the side of the bed? : Unable Difficulty sitting down on and standing up from a chair with arms (e.g., wheelchair, bedside commode, etc,.)?: Unable Help needed moving to and from a bed to chair (including a wheelchair)?: A Little Help needed walking in hospital room?: A Little Help needed climbing 3-5 steps with a railing? : A Little 6 Click Score: 14    End of Session Equipment Utilized During Treatment: Gait belt Activity Tolerance: Patient tolerated treatment well Patient left: Other (comment)(sitting edge of bed with spouse for personal hygiene )   PT Visit Diagnosis: Difficulty in walking, not elsewhere classified (R26.2);Muscle weakness (generalized) (M62.81);Pain Pain - Right/Left: Right Pain - part of body: Hip     Time: 2694-8546 PT Time Calculation (min) (ACUTE ONLY): 35 min  Charges:  $Gait Training: 8-22 mins $Therapeutic Activity: 8-22 mins                    G Codes:        Larae Grooms, PTA 11/20/2017, 10:35 AM

## 2017-11-20 NOTE — Care Management Note (Addendum)
Case Management Note  Patient Details  Name: Justin Tyler MRN: [id number] Date of Birth: 04/11/1956  Subjective/Objective:       Justin Tyler has [name] and RW at bedside for home use. Discussed discharge planning with Justin Tyler. Their [place], [name] [phone], is aware of discharge home today and will set up home health services. Justin Tyler was instructed by [facility] to call her on Monday 11/22/17 for details about home health services set up by [employer].Call to [facility] in [place] about Lovenox co-pay which is $100. Dr Ladoris Gene reports that he can afford the $100 co-pay.             Action/Plan:i   Expected Discharge Date:  11/20/17               Expected Discharge Plan:  OP Rehab  In-House Referral:     Discharge planning Services  CM Consult  Post Acute Care Choice:  Durable Medical Equipment Choice offered to:  Patient  DME Arranged:  Bedside commode, Walker rolling DME Agency:  [place]:    [facility] Agency:     Status of Service:  In process, will continue to follow  If discussed at Long Length of Stay Meetings, dates discussed:    Additional Comments:  Justin Zinni A, RN 11/20/2017, 8:45 AM

## 2017-11-20 NOTE — Progress Notes (Signed)
   Subjective: 2 Days Post-Op Procedure(s) (LRB): TOTAL HIP ARTHROPLASTY (Left) Patient reports pain as mild.   Patient is well, and has had no acute complaints or problems She did very well with physical therapy yesterday.  Met all goals to return to home. Plan is to go Home after hospital stay. no nausea and no vomiting Patient denies any chest pains or shortness of breath. Objective: Vital signs in last 24 hours: Temp:  [98.7 F (37.1 C)-100.6 F (38.1 C)] 98.9 F (37.2 C) (03/09 0737) Pulse Rate:  [63-81] 69 (03/09 0737) Resp:  [16-20] 16 (03/09 0737) BP: (93-114)/(59-66) 114/64 (03/09 0737) SpO2:  [96 %-100 %] 97 % (03/09 0737) well approximated incision Heels are non tender and elevated off the bed using rolled towels Intake/Output from previous day: 03/08 0701 - 03/09 0700 In: 2140 [P.O.:240; I.V.:1900] Out: 720 [Urine:720] Intake/Output this shift: No intake/output data recorded.  Recent Labs    11/19/17 0535  HGB 10.8*   Recent Labs    11/19/17 0535  WBC 8.0  RBC 3.46*  HCT 31.8*  PLT 150   Recent Labs    11/19/17 0535 11/20/17 0357  NA 140 141  K 3.7 3.5  CL 109 108  CO2 24 26  BUN 15 14  CREATININE 0.90 0.81  GLUCOSE 132* 112*  CALCIUM 8.0* 7.7*   No results for input(s): LABPT, INR in the last 72 hours.  EXAM General - Patient is Alert, Appropriate and Oriented Extremity - Neurologically intact Neurovascular intact Sensation intact distally Intact pulses distally Dorsiflexion/Plantar flexion intact No cellulitis present Compartment soft Dressing - dressing C/D/I Motor Function - intact, moving foot and toes well on exam.    Past Medical History:  Diagnosis Date  . Anxiety   . Arthritis   . Heart murmur   . History of hiatal hernia   . Hypertension     Assessment/Plan: 2 Days Post-Op Procedure(s) (LRB): TOTAL HIP ARTHROPLASTY (Left) Active Problems:   Status post total hip replacement, left  Estimated body mass index is  27.39 kg/m as calculated from the following:   Height as of this encounter: _0  (1.93 m).   Weight as of this encounter: 102.1 kg (225 lb). Up with therapy Discharge home with home health once patient has had a bowel movement  Labs: Were reviewed and acceptable DVT Prophylaxis - Lovenox, Foot Pumps and TED hose Weight-Bearing as tolerated to left leg Lactulose ordered Please change dressing prior to patient being discharged and give the patient to x-ray honeycomb dressings to take home  [place]. [place] 11/20/2017, 8:02 AM

## 2017-11-20 NOTE — Progress Notes (Signed)
Patient and wife educated how to administer subcut. injection. Wife preformed injection properly after education, stated no further questions.  Justin Tyler, SN [facility]

## 2017-11-23 LAB — SURGICAL PATHOLOGY

## 2018-04-15 ENCOUNTER — Ambulatory Visit: Payer: Managed Care, Other (non HMO) | Admitting: Urology

## 2018-04-15 ENCOUNTER — Encounter: Payer: Self-pay | Admitting: Urology

## 2018-09-26 ENCOUNTER — Other Ambulatory Visit: Payer: Self-pay | Admitting: Student

## 2018-09-26 DIAGNOSIS — R9389 Abnormal findings on diagnostic imaging of other specified body structures: Secondary | ICD-10-CM

## 2018-09-27 DIAGNOSIS — R079 Chest pain, unspecified: Secondary | ICD-10-CM | POA: Insufficient documentation

## 2018-10-03 NOTE — Progress Notes (Signed)
10/04/2018  8:28 PM   Justin Tyler Oct 24, 1955 [id number]  Referring provider: Wayland Denis, PA-C [address] [place], [place] [address]  Chief Complaint  Patient presents with  . Elevated PSA    HPI: Justin Tyler is a 63 y.o. White or Caucasian male that presents today to establish care for his elevated PSA. He was referred by his PCP, Wayland Denis PA-C.   He was referred over 6 months ago. He no showed an appointment on 04/15/2018. He reports that he had a hip replacement surgery around that time this deferred his follow up.  His last PSA 6.01 on 03/07/2018 and 6.07 02/08/2018. No previous PSAs documented for comparison.  No family history of prostate cancer. No bone pain.  Patient reports his symptoms have occurred for the past couple years and have been relatively consistent. He feels that he empties completely. No history of UTIs. He noticed that he has increased frequency.  IPSS as below.    IPSS    Row Name 10/04/18 1300         International Prostate Symptom Score   How often have you had the sensation of not emptying your bladder?  Less than half the time     How often have you had to urinate less than every two hours?  Less than half the time     How often have you found you stopped and started again several times when you urinated?  Not at All     How often have you found it difficult to postpone urination?  More than half the time     How often have you had a weak urinary stream?  More than half the time     How often have you had to strain to start urination?  Less than 1 in 5 times     How many times did you typically get up at night to urinate?  3 Times     Total IPSS Score  16       Quality of Life due to urinary symptoms   If you were to spend the rest of your life with your urinary condition just the way it is now how would you feel about that?  Mixed        Score:  1-7 Mild 8-19 Moderate 20-35 Severe   PMH: Past Medical  History:  Diagnosis Date  . Anxiety   . Arthritis   . Heart murmur   . History of hiatal hernia   . Hypertension     Surgical History: Past Surgical History:  Procedure Laterality Date  . TOTAL HIP ARTHROPLASTY Right   . TOTAL HIP ARTHROPLASTY Left 11/18/2017   Procedure: TOTAL HIP ARTHROPLASTY;  Surgeon: Corky Mull, MD;  Location: [facility] ORS;  Service: Orthopedics;  Laterality: Left;  . TOTAL HIP REVISION Right     Home Medications:  Allergies as of 10/04/2018   No Known Allergies     Medication List       Accurate as of October 04, 2018  8:28 PM. Always use your most recent med list.        amLODipine 5 MG tablet Commonly known as:  NORVASC Take 5 mg by mouth every evening.   aspirin 81 MG chewable tablet aspirin 81 mg chewable tablet  Chew 1 tablet every day by oral route.   carvedilol 3.125 MG tablet Commonly known as:  COREG Take by mouth.   losartan 100 MG tablet Commonly known as:  COZAAR Take 50 mg by mouth 2 (two) times daily.   MULTIVITAMIN GUMMIES ADULT Chew Chew 1 tablet by mouth daily.   SUMAtriptan 100 MG tablet Commonly known as:  IMITREX Take 100 mg by mouth every 2 (two) hours as needed for migraine. May repeat in 2 hours if headache persists or recurs.   venlafaxine XR 150 MG 24 hr capsule Commonly known as:  EFFEXOR-XR Take 150 mg by mouth daily with breakfast.       Allergies: No Known Allergies  Family History: History reviewed. No pertinent family history.  Social History:  reports that he quit smoking about 17 years ago. He has never used smokeless tobacco. He reports current alcohol use. No history on file for drug.  ROS: UROLOGY Frequent Urination?: Yes Hard to postpone urination?: Yes Burning/pain with urination?: No Get up at night to urinate?: Yes Leakage of urine?: Yes Urine stream starts and stops?: No Trouble starting stream?: Yes Do you have to strain to urinate?: No Blood in urine?: No Urinary tract  infection?: No Sexually transmitted disease?: No Injury to kidneys or bladder?: No Painful intercourse?: No Weak stream?: Yes Erection problems?: No Penile pain?: No  Gastrointestinal Nausea?: No Vomiting?: No Indigestion/heartburn?: No Diarrhea?: No Constipation?: Yes  Constitutional Fever: No Night sweats?: No Weight loss?: No Fatigue?: Yes  Skin Skin rash/lesions?: No Itching?: No  Eyes Blurred vision?: No Double vision?: No  Ears/Nose/Throat Sore throat?: No Sinus problems?: No  Hematologic/Lymphatic Swollen glands?: No Easy bruising?: No  Cardiovascular Leg swelling?: No Chest pain?: No  Respiratory Cough?: No Shortness of breath?: No  Endocrine Excessive thirst?: No  Musculoskeletal Back pain?: No Joint pain?: No  Neurological Headaches?: Yes Dizziness?: No  Psychologic Depression?: No Anxiety?: Yes  Physical Exam: BP (!) 144/89 (BP Location: Left Arm, Patient Position: Sitting, Cuff Size: Large)   Pulse 67   Ht 6\' 4"  (1.93 m)   Wt 242 lb (109.8 kg)   BMI 29.46 kg/m   Vitals signs reviewed.  Constitutional:      General: He is not in acute distress. HENT:     Head: Normocephalic and atraumatic.  Pulmonary:     Effort: Pulmonary effort is normal. No respiratory distress.  Abdominal:     Tenderness: There is no right CVA tenderness or left CVA tenderness. Rectal: Normal sphincter tone. Prostate is mildly enlarged, rubbery, lateral lobes, 50 grams, non tender, no nodules.  Skin:    Findings: No bruising, lesion or rash.  Neurological:     General: No focal deficit present.     Mental Status: He is alert and oriented to person, place, and time.  Psychiatric:        Mood and Affect: Mood and affect normal.     Ref. Range & Units 02/08/2018 9:00 AM   03/07/2018 8:45 AM  Prostate Specific Antigen, Total 0.1 - 4 ng/mL 6.17 (H) 6.01 (H)   Results for orders placed or performed in visit on 10/04/18  Bladder Scan (Post Void  Residual) in office  Result Value Ref Range   Scan Result 27      Assessment & Plan:    1. Elevated PSA  - At this time, I have advised the patient that we will repeat the PSA to rule out lab error.  If that should return elevated, we could continue observation or pursue a prostate biopsy. Will call patient to update with results and associated plan  -  I discussed with the patient that PSA is an acronym  for  prostate specific antigen,  which is a protein made by the prostate gland and can be detected in the blood stream. We reviewed the implications of an elevated PSA and the uncertainty surrounding it. In general, a man's PSA increases with age and is produced by both normal and cancerous prostate tissue. Differential for elevated PSA is BPH, prostate cancer, infection, recent intercourse/ejaculation, prostate infarction, recent urethroscopic manipulation (foley placement/cystoscopy) and prostatitis. Management of an elevated PSA can include observation or prostate biopsy and wediscussed this in detail.  - We discussed that indications for prostate biopsy are defined by age and race specific PSA cutoffs as well as a PSA velocity of 0.75/year.  - We discussed prostate biopsy in detail including the procedure itself, the risks of blood in the urine, stool, and ejaculate, serious infection, and discomfort.       2. BPH with obstruction Increasing urinary symptoms secondary to BPH Discussed pharmacotherapy vs. Surgery Not enough bother to pursue treatment Adequate bladder empting  Will call with PSA results and [place] [address], [place], [place] [address] [phone]  I, Temidayo Atanda-Ogunleye , am acting as a scribe for Hollice Espy, MD  I have reviewed the above documentation for accuracy and completeness, and I agree with the above.   Hollice Espy, MD

## 2018-10-04 ENCOUNTER — Encounter: Payer: Self-pay | Admitting: Urology

## 2018-10-04 ENCOUNTER — Ambulatory Visit: Payer: PRIVATE HEALTH INSURANCE | Admitting: Urology

## 2018-10-04 VITALS — BP 144/89 | HR 67 | Ht 76.0 in | Wt 242.0 lb

## 2018-10-04 DIAGNOSIS — R972 Elevated prostate specific antigen [PSA]: Secondary | ICD-10-CM | POA: Diagnosis not present

## 2018-10-04 DIAGNOSIS — N138 Other obstructive and reflux uropathy: Secondary | ICD-10-CM | POA: Diagnosis not present

## 2018-10-04 DIAGNOSIS — N401 Enlarged prostate with lower urinary tract symptoms: Secondary | ICD-10-CM

## 2018-10-04 LAB — BLADDER SCAN AMB NON-IMAGING: Scan Result: 27

## 2018-10-05 ENCOUNTER — Ambulatory Visit
Admission: RE | Admit: 2018-10-05 | Discharge: 2018-10-05 | Disposition: A | Discharge status: Home / Self Care | Payer: PRIVATE HEALTH INSURANCE | Source: Ambulatory Visit | Attending: Student | Admitting: Student

## 2018-10-05 DIAGNOSIS — R9389 Abnormal findings on diagnostic imaging of other specified body structures: Secondary | ICD-10-CM | POA: Diagnosis present

## 2018-10-05 LAB — PSA: PROSTATE SPECIFIC AG, SERUM: 5.7 ng/mL — AB (ref 0.0–4.0)

## 2018-10-06 ENCOUNTER — Telehealth: Payer: Self-pay | Admitting: Urology

## 2018-10-06 NOTE — Telephone Encounter (Signed)
Pt states that since he had his prostate exam 2 days ago he has noticed blood in his urine  at  The end of the day. He would like a call back please.

## 2018-10-06 NOTE — Telephone Encounter (Signed)
Advised patient blood in urine can be seen sometimes after an exam and it should clear itself up and if it does not to contact us.

## 2018-10-07 DIAGNOSIS — I700 Atherosclerosis of aorta: Secondary | ICD-10-CM | POA: Insufficient documentation

## 2019-01-19 IMAGING — CR DG CHEST 2V
2 series · 3 of 3 positions shown · non-contrast
Comparison: None.

CLINICAL DATA: Preoperative evaluation for upcoming hip surgery

EXAM:
CHEST  2 VIEW

[chest pa]
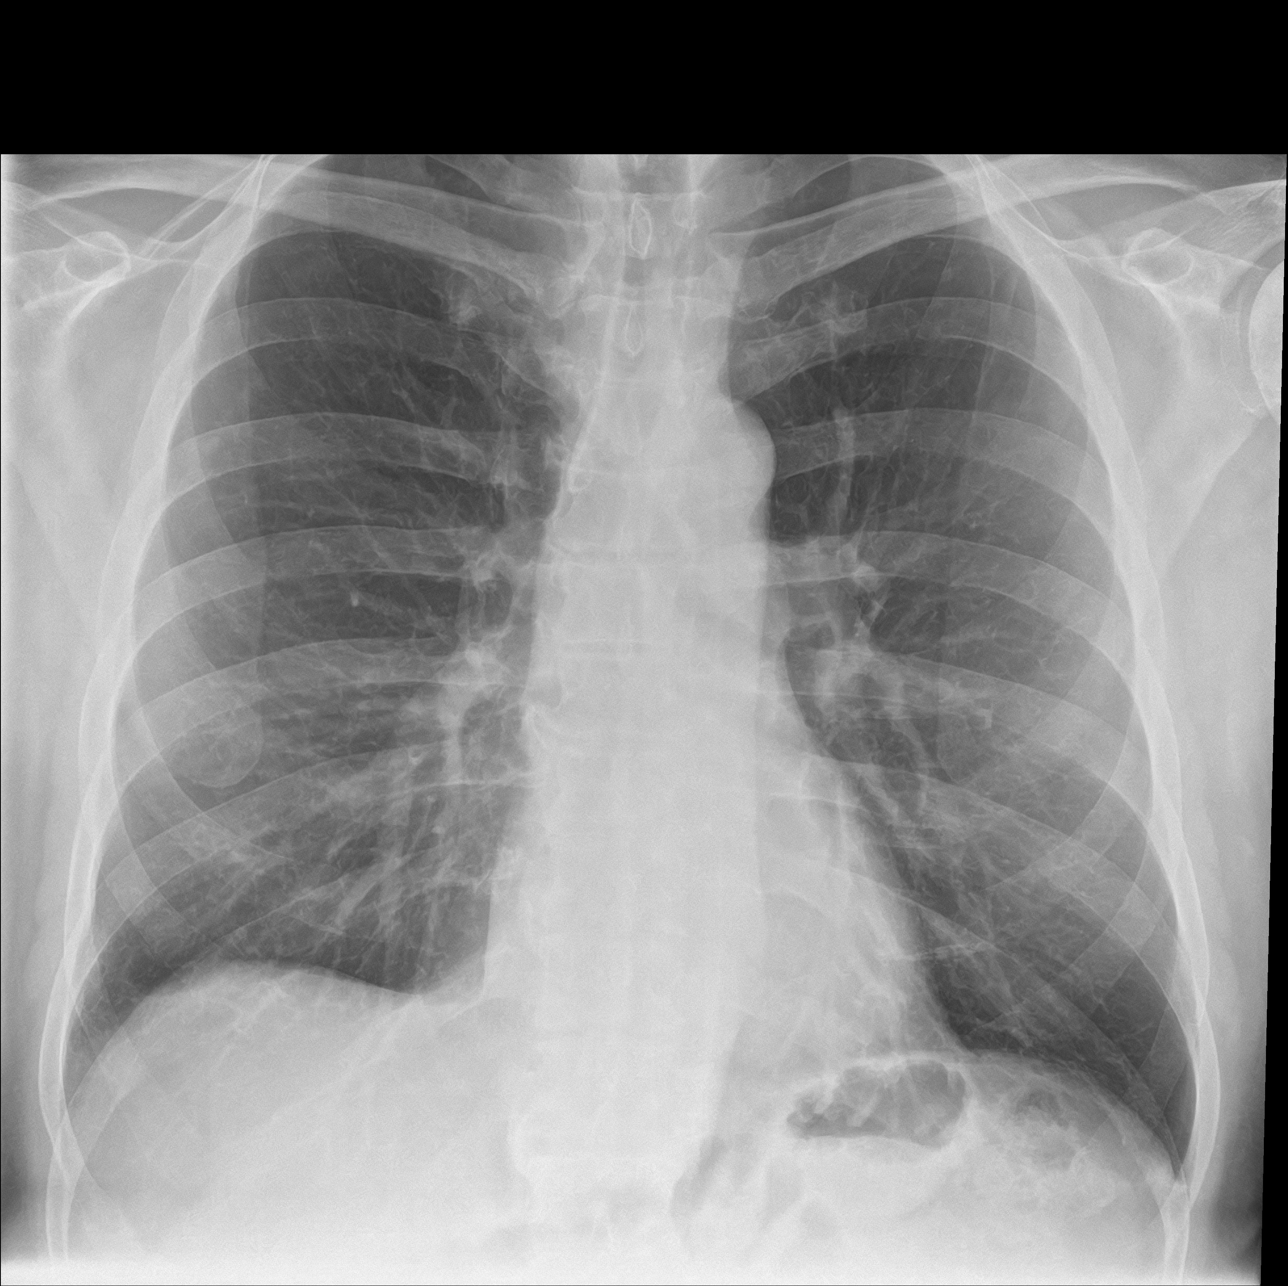

[Series 2: chest lat · 0.14mm/px · 2 of 2 slices shown]
[im 1/2]
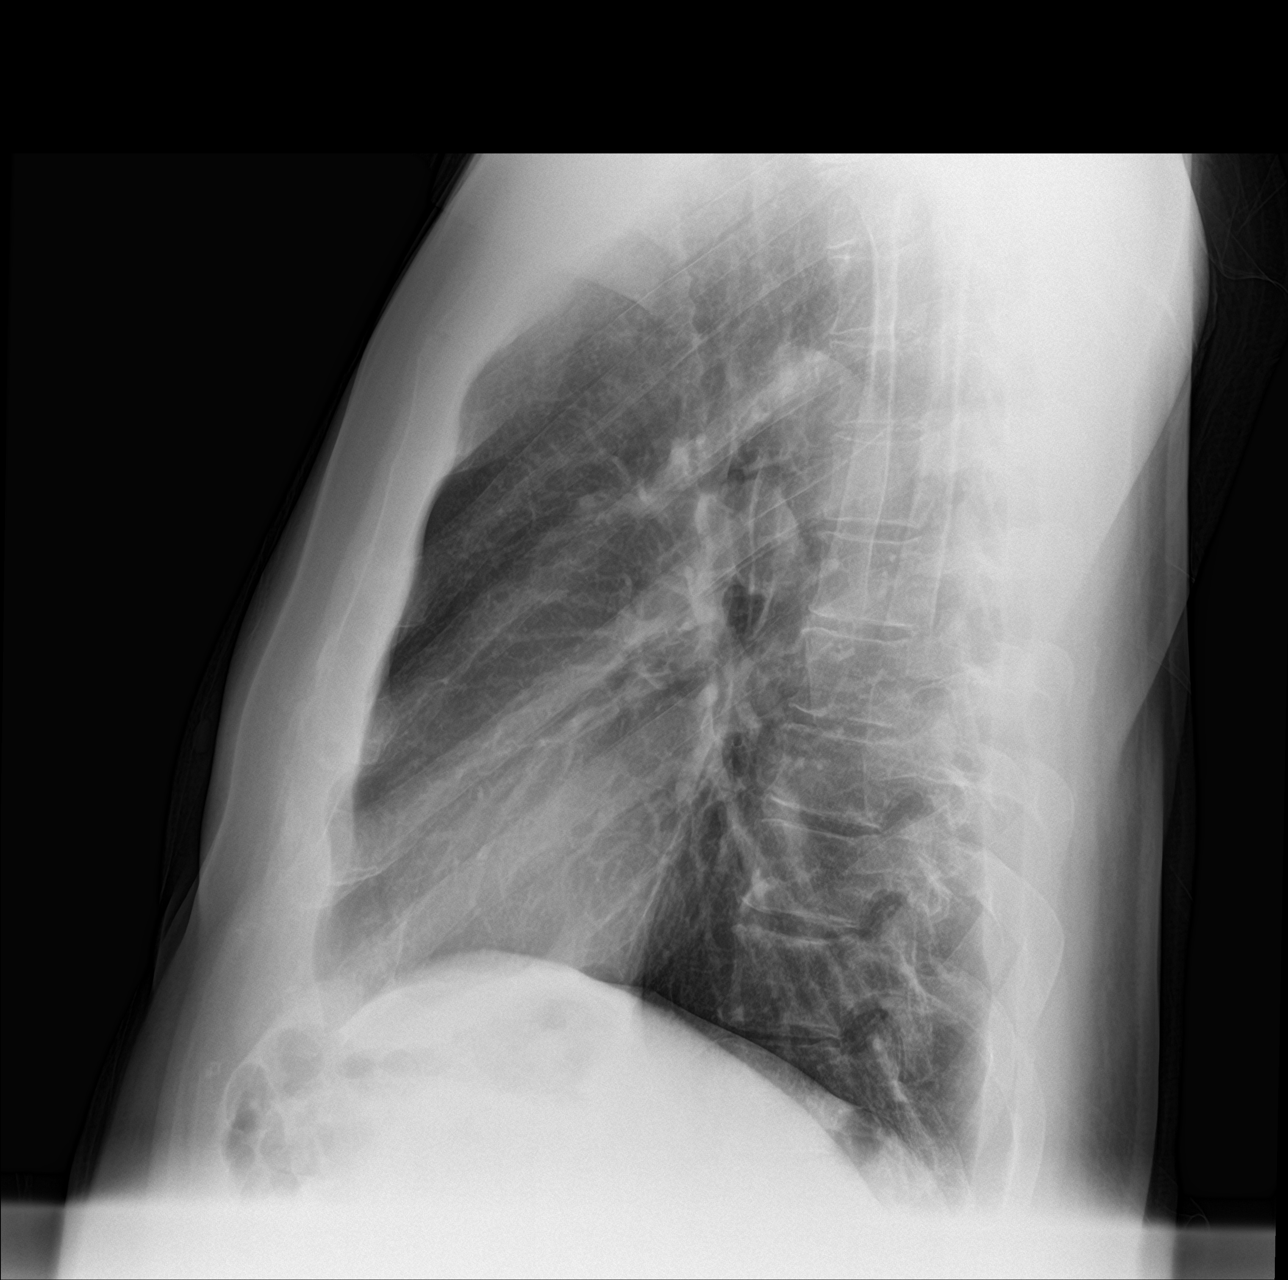
[im 2/2]
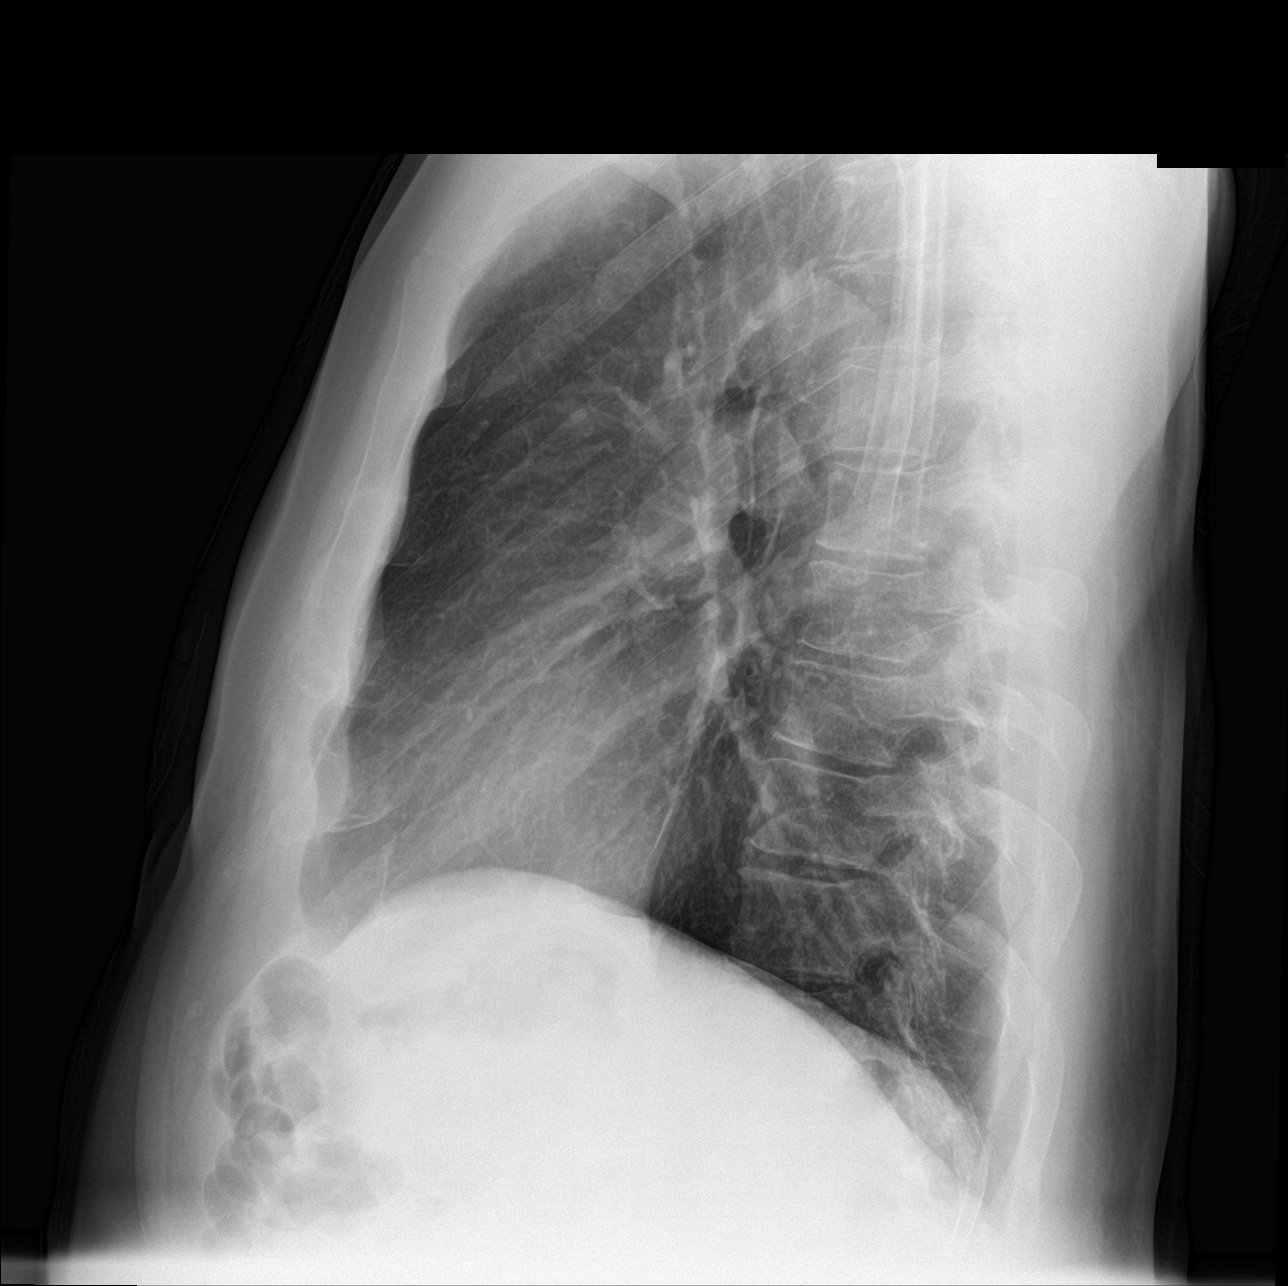

[3 of 3 positions shown; findings below may reference images not displayed]

FINDINGS: Cardiac shadow is within normal limits. The lungs are well aerated
bilaterally. No focal infiltrate or sizable effusion is noted. Tiny
nodular density is noted over the posterior aspect of the right
sixth rib.
IMPRESSION: Tiny nodular density as described on the right. Non-contrast CT may
be helpful for further evaluation.

## 2019-02-03 IMAGING — DX DG HIP (WITH OR WITHOUT PELVIS) 2-3V*L*
2 series · 2 of 2 positions shown · non-contrast
Comparison: None.

CLINICAL DATA: Status post LEFT total hip arthroplasty.

EXAM:
DG HIP (WITH OR WITHOUT PELVIS) 2-3V LEFT

[pelvis ap]
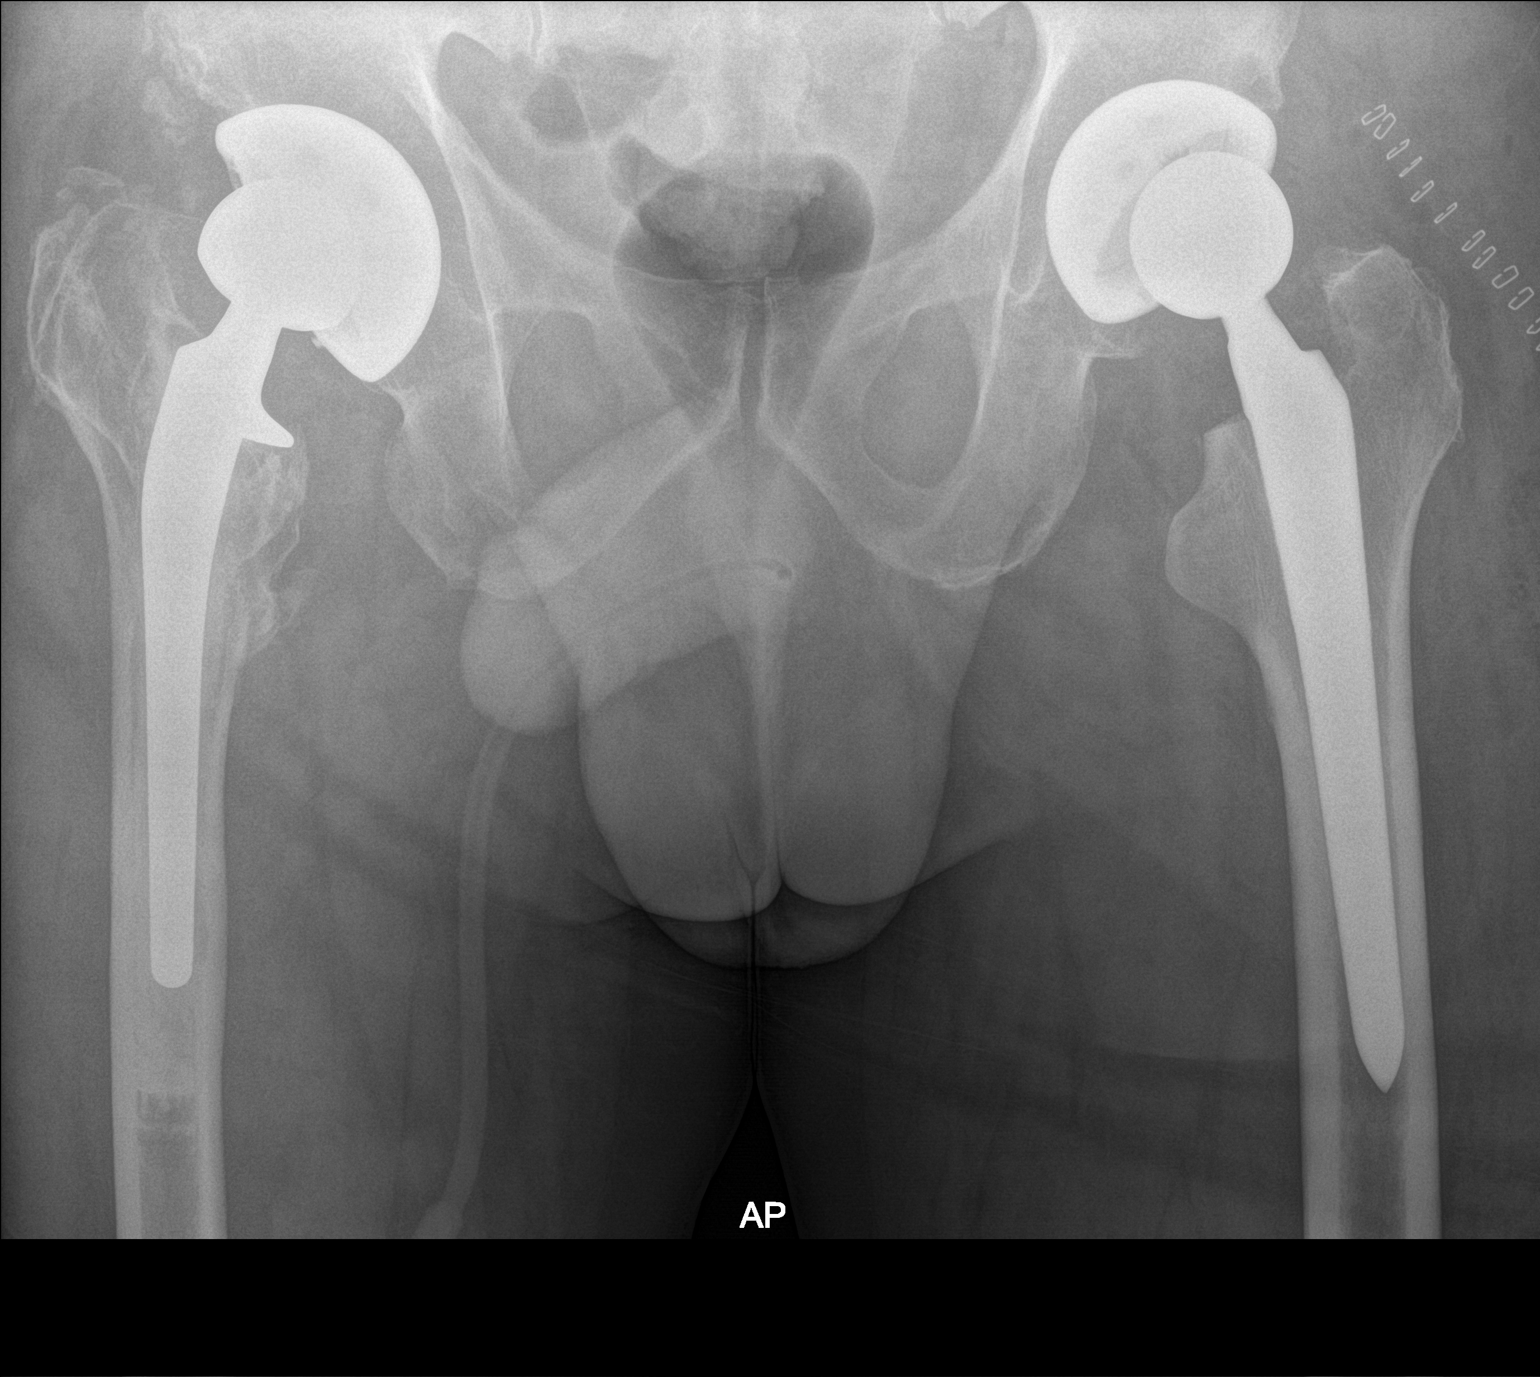

[hip lat]
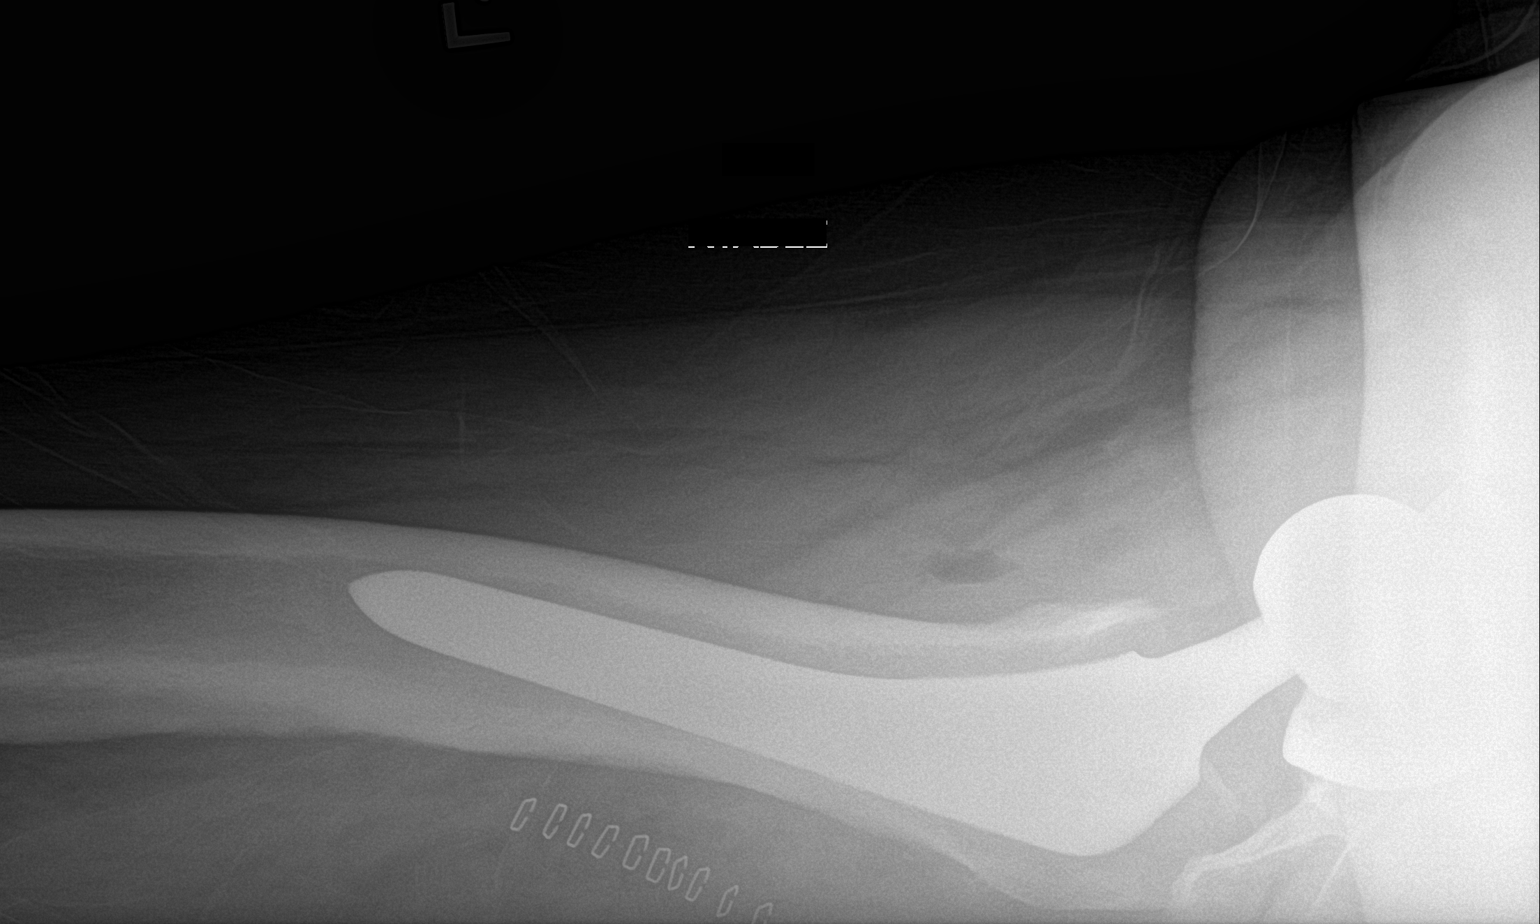

[2 of 2 positions shown; findings below may reference images not displayed]

FINDINGS: LEFT total hip arthroplasty with overlying surgical staples
identified. There is no evidence of dislocation.

No complicating features are identified.

RIGHT total hip arthroplasty also identified.

Foley catheter noted with tip overlying the penile urethra.
IMPRESSION: LEFT total hip arthroplasty without complicating features.

Foley catheter tip overlying the penile urethra.

## 2019-02-07 ENCOUNTER — Telehealth: Payer: PRIVATE HEALTH INSURANCE | Admitting: Urology

## 2019-02-07 ENCOUNTER — Other Ambulatory Visit: Payer: Self-pay

## 2019-02-28 ENCOUNTER — Other Ambulatory Visit: Payer: Self-pay

## 2019-02-28 DIAGNOSIS — R972 Elevated prostate specific antigen [PSA]: Secondary | ICD-10-CM

## 2019-03-01 ENCOUNTER — Other Ambulatory Visit
Admission: RE | Admit: 2019-03-01 | Discharge: 2019-03-01 | Disposition: A | Discharge status: Home / Self Care | Payer: Medicare PPO | Source: Ambulatory Visit | Attending: Urology | Admitting: Urology

## 2019-03-01 ENCOUNTER — Other Ambulatory Visit: Payer: PRIVATE HEALTH INSURANCE

## 2019-03-01 ENCOUNTER — Encounter: Payer: Self-pay | Admitting: Urology

## 2019-03-01 DIAGNOSIS — R972 Elevated prostate specific antigen [PSA]: Secondary | ICD-10-CM

## 2019-03-01 LAB — PSA: Prostatic Specific Antigen: 6.32 ng/mL — ABNORMAL HIGH (ref 0.00–4.00)

## 2019-03-01 NOTE — Addendum Note (Signed)
Addended by: Santiago Bur on: 03/01/2019 09:19 AM   Modules accepted: Orders

## 2019-03-08 ENCOUNTER — Other Ambulatory Visit: Payer: Self-pay

## 2019-03-08 ENCOUNTER — Telehealth (INDEPENDENT_AMBULATORY_CARE_PROVIDER_SITE_OTHER): Payer: Medicare PPO | Admitting: Urology

## 2019-03-08 DIAGNOSIS — R972 Elevated prostate specific antigen [PSA]: Secondary | ICD-10-CM | POA: Diagnosis not present

## 2019-03-08 NOTE — Progress Notes (Signed)
Virtual Visit via Video Note  I connected with Justin Tyler on 03/08/19 at 10:15 AM EDT by a video enabled telemedicine application and verified that I am speaking with the correct person using two identifiers.  Location: Patient: home  Provider: office   I discussed the limitations of evaluation and management by telemedicine and the availability of in person appointments. The patient expressed understanding and agreed to proceed.  History of Present Illness: 63 year old male with a history of elevated PSA who returns today via virtual visit to discuss his PSA.  He was referred approximately a year ago with a history of elevated, albeit stably elevated PSA.  PSA trend as below.  His most recent PSA is essentially stable from previous.  He denies any significant urinary symptoms as per previous conversations.  With no weight loss or bone pain.  No family history of prostate cancer.  He does noted today that he is had bilateral hip replacements.  Recent rectal exam on 09/2018 unremarkable.  Mildly enlarged, rubbery lateral lobes.  PSA trend: 6.36 on 09/30/2018 5.7 on 10/04/2018 6.01 and 03/07/2018 6.07 on 02/08/2018    Observations/Objective: Appears well, standing in front of a log cabin  Assessment and Plan:  1. Elevated PSA Stably elevated PSA x1 year  We discussed today that unfortunately we do not have a significant mount of historical data This may be appropriate for his gland size but given his slight recent rise, we did discuss options including pursuing prostate biopsy versus prostate MRI which may be helpful to identify any significant high-grade lesions versus recertification test  He is most rigid and prostate MRI.  He understands it if high-grade lesion is identified, he would need a prostate biopsy, ideally a targeted biopsy.  We also discussed that given his bilateral hip replacement, we may have difficulty interpreting the imaging.  He understands all this and is  willing to proceed.  We will plan for prostate MRI, call with results and plan thereafter - [place]; Future   Follow Up Instructions: Will call with prostate MR results   I discussed the assessment and treatment plan with the patient. The patient was provided an opportunity to ask questions and all were answered. The patient agreed with the plan and demonstrated an understanding of the instructions.   The patient was advised to call back or seek an in-person evaluation if the symptoms worsen or if the condition fails to improve as anticipated.  I provided 12 minutes of non-face-to-face time during this encounter.   Hollice Espy, MD

## 2019-03-29 ENCOUNTER — Telehealth: Payer: Self-pay | Admitting: Urology

## 2019-03-29 DIAGNOSIS — R972 Elevated prostate specific antigen [PSA]: Secondary | ICD-10-CM

## 2019-03-29 NOTE — Telephone Encounter (Signed)
New order placed

## 2019-03-29 NOTE — Telephone Encounter (Signed)
Patient is scheduled for an MRI but since he has had a hip replacement he will need to be scheduled at [facility] imaging. We are CX his scan here at [facility] and they will need a new order for [facility] [place]. Can you do that for me please and thank you? Make sure you pick that location   [facility]

## 2019-04-01 ENCOUNTER — Ambulatory Visit: Payer: Medicare PPO

## 2019-04-03 ENCOUNTER — Telehealth: Payer: Self-pay | Admitting: Urology

## 2019-04-03 NOTE — Telephone Encounter (Signed)
Patient called the office today regarding a prostate MRI that was scheduled at [facility] and then [facility].  The MRI cannot be done at these locations due to the patient's hips (bilateral hip replacement).  [facility] indicated to the patient that it might be able to be done at [facility].  She is wanting to know if we can confirm, then change the order, update the authorization, and get this rescheduled.  Patient can be reached at [phone].

## 2019-04-03 NOTE — Telephone Encounter (Signed)
I spoke with his wife before I went to lunch today and gave him the number to [facility] and she is going to call and get it scheduled. It was actually schedule here @ [facility] but needs to be done @ [place] per MRI. I had to get a new order before I could get it released.All has been corrected and done.   Sharyn Lull

## 2019-04-13 ENCOUNTER — Other Ambulatory Visit: Payer: Self-pay

## 2019-04-13 ENCOUNTER — Ambulatory Visit (HOSPITAL_COMMUNITY)
Admission: RE | Admit: 2019-04-13 | Discharge: 2019-04-13 | Disposition: A | Discharge status: Home / Self Care | Payer: Medicare PPO | Source: Ambulatory Visit | Attending: Urology | Admitting: Urology

## 2019-04-13 DIAGNOSIS — R972 Elevated prostate specific antigen [PSA]: Secondary | ICD-10-CM

## 2019-04-13 LAB — POCT I-STAT CREATININE: Creatinine, Ser: 0.8 mg/dL (ref 0.61–1.24)

## 2019-04-13 MED ORDER — GADOBUTROL 1 MMOL/ML IV SOLN
10.0000 mL | Freq: Once | INTRAVENOUS | Status: AC | PRN
  Administered 2019-04-13: 10 mL via INTRAVENOUS

## 2019-04-13 MED ORDER — LIDOCAINE HCL URETHRAL/MUCOSAL 2 % EX GEL
CUTANEOUS | Status: AC
  Filled 2019-04-13: qty 30

## 2019-04-13 MED ORDER — LIDOCAINE HCL URETHRAL/MUCOSAL 2 % EX GEL: 1.0000 "application " | Freq: Once | CUTANEOUS | Status: DC

## 2019-04-17 ENCOUNTER — Telehealth: Payer: Self-pay | Admitting: Urology

## 2019-04-17 DIAGNOSIS — R972 Elevated prostate specific antigen [PSA]: Secondary | ICD-10-CM

## 2019-04-17 NOTE — Telephone Encounter (Signed)
Pt LMOM and would like a call back with his Prostate  MRI results from 04/13/2019. Please advise.

## 2019-04-17 NOTE — Telephone Encounter (Signed)
I actually called him earlier this morning prior to this message but he did not answer his call.  I was able to follow-up with him this afternoon.  We discussed that there is PI-RADS 4 lesion, highly suspicious for prostate cancer.  There is no other evidence of prostate cancer involving the capsule or lymph nodes.  Would strongly recommend prostate fusion biopsy.  We discussed that this can be performed in [place] by my partners.  We discussed the risk of prostate biopsy in detail including the risk of bleeding, infection, amongst others.  He understands the procedure and protocols and will be contacted to schedule this.  He will follow-up with me for prostate biopsy results.  All questions answered.

## 2019-04-18 NOTE — Addendum Note (Signed)
Addended by: Tommy Rainwater on: 04/18/2019 10:49 AM   Modules accepted: Orders

## 2019-04-18 NOTE — Telephone Encounter (Signed)
Referral sent to [facility] 

## 2019-05-01 ENCOUNTER — Telehealth: Payer: Self-pay | Admitting: Urology

## 2019-05-01 NOTE — Telephone Encounter (Signed)
Pt is scheduled for colonoscopy on 8/27 and a prostate biopsy this Thursday. Wife wants to know if this would be safe or if he should cancel or postpone colonoscopy.

## 2019-05-02 NOTE — Telephone Encounter (Signed)
I think that's fine.  OK to proceed with both.  Hollice Espy, MD

## 2019-05-02 NOTE — Telephone Encounter (Signed)
Are there any additional risks you are aware of or should patient clear this through GI? Please advise, thanks

## 2019-05-02 NOTE — Telephone Encounter (Signed)
Called pt's wife to inform her of the information below per Dr. Erlene Quan, she state that they decided to cancel colonoscopy and schedule at a later date and time.

## 2019-05-09 ENCOUNTER — Other Ambulatory Visit: Payer: Self-pay | Admitting: Urology

## 2019-05-16 ENCOUNTER — Encounter: Payer: Self-pay | Admitting: Urology

## 2019-05-16 ENCOUNTER — Other Ambulatory Visit: Payer: Self-pay

## 2019-05-16 ENCOUNTER — Ambulatory Visit: Payer: Medicare PPO | Admitting: Urology

## 2019-05-16 VITALS — BP 145/92 | HR 73 | Ht 76.0 in | Wt 234.0 lb

## 2019-05-16 DIAGNOSIS — N5203 Combined arterial insufficiency and corporo-venous occlusive erectile dysfunction: Secondary | ICD-10-CM | POA: Diagnosis not present

## 2019-05-16 DIAGNOSIS — C61 Malignant neoplasm of prostate: Secondary | ICD-10-CM

## 2019-05-16 NOTE — Progress Notes (Signed)
05/16/2019 1:45 PM   Justin Tyler 09-30-1955 [id number]  Referring provider: Wayland Denis, PA-C [address] [place],   [address]  Chief Complaint  Patient presents with  . Follow-up    HPI: 63 year old male who presents today to discuss his newly diagnosed prostate cancer.  He initially presented a little over a year ago with elevated PSA at 6.32.  Rectal exam was unremarkable.  He underwent prostate MRI demonstrating 2 lesions in the left mid peripheral zone, PI-RADS 4 suspicious for high-grade lesion.  No evidence of extracapsular extension, seminal vesicle involvement or metastatic disease on this study.  He was referred to [facility] urology for fusion biopsy.  TRUS vol 67 cc  Pathology consistent with intermediate risk prostate cancer, Gleason 4+3 involving up to 50% primarily on the right.  He also had a single core Gleason 3+3 on the left.  He had a total of 6 of 12 cores involved.  Regions of interest on fusion biopsy were benign.  No previous abdominal surgeries.  He does have baseline erectile dysfunction, station below.  He has mild baseline urinary symptoms.  Not on any BPH medications.  He and his wife have reviewed the above pathology with Dr. Tresa Moore by telephone.  They are leaning toward surgery coming into this appointment.  His wife is available today by telephone speaker phone.  He and his wife enjoy traveling and will be driving to [place] in the [place].  They are interested in having surgery done sooner rather than later so they can get back to doing this.  IPSS    Row Name 05/16/19 1200         International Prostate Symptom Score   How often have you had the sensation of not emptying your bladder?  About half the time     How often have you had to urinate less than every two hours?  About half the time     How often have you found you stopped and started again several times when you urinated?  About half the time     How  often have you found it difficult to postpone urination?  Less than 1 in 5 times     How often have you had a weak urinary stream?  Less than half the time     How often have you had to strain to start urination?  Not at All     How many times did you typically get up at night to urinate?  2 Times     Total IPSS Score  14       Quality of Life due to urinary symptoms   If you were to spend the rest of your life with your urinary condition just the way it is now how would you feel about that?  Mixed        Score:  1-7 Mild 8-19 Moderate 20-35 Severe   PMH: Past Medical History:  Diagnosis Date  . Anxiety   . Arthritis   . Heart murmur   . History of hiatal hernia   . Hypertension     Surgical History: Past Surgical History:  Procedure Laterality Date  . TOTAL HIP ARTHROPLASTY Right   . TOTAL HIP ARTHROPLASTY Left 11/18/2017   Procedure: TOTAL HIP ARTHROPLASTY;  Surgeon: Corky Mull, MD;  Location: [facility] ORS;  Service: Orthopedics;  Laterality: Left;  . TOTAL HIP REVISION Right     Home Medications:  Allergies as of 05/16/2019  No Known Allergies     Medication List       Accurate as of May 16, 2019  1:45 PM. If you have any questions, ask your nurse or doctor.        amLODipine 5 MG tablet Commonly known as: NORVASC Take 5 mg by mouth every evening.   aspirin 81 MG chewable tablet aspirin 81 mg chewable tablet  Chew 1 tablet every day by oral route.   carvedilol 3.125 MG tablet Commonly known as: COREG Take by mouth.   losartan 100 MG tablet Commonly known as: COZAAR Take 50 mg by mouth 2 (two) times daily.   Multivitamin Gummies Adult Chew Chew 1 tablet by mouth daily.   SUMAtriptan 100 MG tablet Commonly known as: IMITREX Take 100 mg by mouth every 2 (two) hours as needed for migraine. May repeat in 2 hours if headache persists or recurs.   venlafaxine XR 150 MG 24 hr capsule Commonly known as: EFFEXOR-XR Take 150 mg by mouth daily with  breakfast.       Allergies: No Known Allergies  Family History: History reviewed. No pertinent family history.  Social History:  reports that he quit smoking about 18 years ago. He has never used smokeless tobacco. He reports current alcohol use. No history on file for drug.  ROS: UROLOGY Frequent Urination?: Yes Hard to postpone urination?: Yes Burning/pain with urination?: No Get up at night to urinate?: Yes Leakage of urine?: No Urine stream starts and stops?: No Trouble starting stream?: No Do you have to strain to urinate?: No Blood in urine?: No Urinary tract infection?: No Sexually transmitted disease?: No Injury to kidneys or bladder?: No Painful intercourse?: No Weak stream?: Yes Erection problems?: No Penile pain?: No  Gastrointestinal Nausea?: No Vomiting?: No Indigestion/heartburn?: No Diarrhea?: No Constipation?: No  Constitutional Fever: No Night sweats?: No Weight loss?: No Fatigue?: No  Skin Skin rash/lesions?: No Itching?: No  Eyes Blurred vision?: No Double vision?: No  Ears/Nose/Throat Sore throat?: No Sinus problems?: No  Hematologic/Lymphatic Swollen glands?: No Easy bruising?: No  Cardiovascular Leg swelling?: No Chest pain?: No  Respiratory Cough?: No Shortness of breath?: No  Endocrine Excessive thirst?: No  Musculoskeletal Back pain?: No Joint pain?: No  Neurological Headaches?: No Dizziness?: No  Psychologic Depression?: No Anxiety?: No  Physical Exam: BP (!) 145/92   Pulse 73   Ht 6\' 4"  (1.93 m)   Wt 234 lb (106.1 kg)   BMI 28.48 kg/m   Constitutional:  Alert and oriented, No acute distress. HEENT: [place] AT, moist mucus membranes.  Trachea midline, no masses. Cardiovascular: No clubbing, cyanosis, or edema. Respiratory: Normal respiratory effort, no increased work of breathing. GI: Abdomen is soft, nontender, nondistended, no abdominal masses Skin: No rashes, bruises or suspicious lesions.  Neurologic: Grossly intact, no focal deficits, moving all 4 extremities. Psychiatric: Normal mood and affect.  Laboratory Data: Lab Results  Component Value Date   WBC 8.0 11/19/2017   HGB 10.8 (L) 11/19/2017   HCT 31.8 (L) 11/19/2017   MCV 91.7 11/19/2017   PLT 150 11/19/2017    Lab Results  Component Value Date   CREATININE 0.80 04/13/2019    As above   Pertinent Imaging: Present MRI reviewed again today, see epic  Assessment & Plan:    1. Prostate cancer [facility]) Newly diagnosed unfavorable intermediate risk prostate cancer  Staging via MRI shows no evidence of extraprostatic extension or metastatic disease  The patient was counseled about the natural history of prostate  cancer and the standard treatment options that are available for prostate cancer. It was explained to him how his age and life expectancy, clinical stage, Gleason score, and PSA affect his prognosis, the decision to proceed with additional staging studies, as well as how that information influences recommended treatment strategies. We discussed the roles for active surveillance, radiation therapy, surgical therapy, androgen deprivation, as well as ablative therapy options for the treatment of prostate cancer as appropriate to his individual cancer situation. We discussed the risks and benefits of these options with regard to their impact on cancer control and also in terms of potential adverse events, complications, and impact on quality of life particularly related to urinary, bowel, and sexual function. The patient was encouraged to ask questions throughout the discussion today and all questions were answered to his stated satisfaction. In addition, the patient was providedwith and/or directed to appropriate resources and literature for further education about prostate cancer treatment options.  Notably, his wife was present via speaker phone today.  She was disappointed that she was not able to physically be  present due to COVID-19 visitor restrictions.  We discussed surgical therapy for prostate cancer including the different available surgical approaches. We discussed, in detail, the risks and expectations of surgery with regard to cancer control, urinary control, and erectile dysfunction as well as expected post operative cover he processed. Additional risks of surgery including but not limited to bleeding, infection, hernia formation, nerve damage, fistula formation, bowel/recalt injury, potentially necessitating colostomy, damage to the urinary tract resulting in urinary leakage, urethral stricture, and cardiopulmonary risk such as myocardial infarction, stroke, death, thromboembolism etc. were explained. The risk of open surgical conversion for robotics/laparoscopic prostatectomy is also discussed.  He is leaning toward surgery as an option.  He is not interested in radiation as a treatment.  He may want a second opinion at a higher volume center.  Happy to revise him with some recommendations if he like to pursue this.  He will let us know if he like to book surgery here to [facility].  I recommended that if he would like to be scheduled for surgery, he be seen and evaluated by physical therapy preop.  Surgery will include bilateral lymphadenectomy based on his stratification.  We will plan for a left-sided nerve sparing procedure and probable partial nerve sparing on the right depending on intraoperative factors.  He is agreeable with this plan.   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

## 2019-05-16 NOTE — H&P (View-Only) (Signed)
05/16/2019 1:45 PM   Nycere Reny 11/20/1955 [id number]  Referring provider: Wayland Denis, PA-C [address] [place],  [place] [address]  Chief Complaint  Patient presents with  . Follow-up    HPI: 63 year old male who presents today to discuss his newly diagnosed prostate cancer.  He initially presented a little over a year ago with elevated PSA at 6.32.  Rectal exam was unremarkable.  He underwent prostate MRI demonstrating 2 lesions in the left mid peripheral zone, PI-RADS 4 suspicious for high-grade lesion.  No evidence of extracapsular extension, seminal vesicle involvement or metastatic disease on this study.  He was referred to [facility] urology for fusion biopsy.  TRUS vol 67 cc  Pathology consistent with intermediate risk prostate cancer, Gleason 4+3 involving up to 50% primarily on the right.  He also had a single core Gleason 3+3 on the left.  He had a total of 6 of 12 cores involved.  Regions of interest on fusion biopsy were benign.  No previous abdominal surgeries.  He does have baseline erectile dysfunction, station below.  He has mild baseline urinary symptoms.  Not on any BPH medications.  He and his wife have reviewed the above pathology with Dr. Tresa Moore by telephone.  They are leaning toward surgery coming into this appointment.  His wife is available today by telephone speaker phone.  He and his wife enjoy traveling and will be driving to [place] in the [place].  They are interested in having surgery done sooner rather than later so they can get back to doing this.  IPSS    Row Name 05/16/19 1200         International Prostate Symptom Score   How often have you had the sensation of not emptying your bladder?  About half the time     How often have you had to urinate less than every two hours?  About half the time     How often have you found you stopped and started again several times when you urinated?  About half the time     How  often have you found it difficult to postpone urination?  Less than 1 in 5 times     How often have you had a weak urinary stream?  Less than half the time     How often have you had to strain to start urination?  Not at All     How many times did you typically get up at night to urinate?  2 Times     Total IPSS Score  14       Quality of Life due to urinary symptoms   If you were to spend the rest of your life with your urinary condition just the way it is now how would you feel about that?  Mixed        Score:  1-7 Mild 8-19 Moderate 20-35 Severe   PMH: Past Medical History:  Diagnosis Date  . Anxiety   . Arthritis   . Heart murmur   . History of hiatal hernia   . Hypertension     Surgical History: Past Surgical History:  Procedure Laterality Date  . TOTAL HIP ARTHROPLASTY Right   . TOTAL HIP ARTHROPLASTY Left 11/18/2017   Procedure: TOTAL HIP ARTHROPLASTY;  Surgeon: Corky Mull, MD;  Location: [facility] ORS;  Service: Orthopedics;  Laterality: Left;  . TOTAL HIP REVISION Right     Home Medications:  Allergies as of 05/16/2019  No Known Allergies     Medication List       Accurate as of May 16, 2019  1:45 PM. If you have any questions, ask your nurse or doctor.        amLODipine 5 MG tablet Commonly known as: NORVASC Take 5 mg by mouth every evening.   aspirin 81 MG chewable tablet aspirin 81 mg chewable tablet  Chew 1 tablet every day by oral route.   carvedilol 3.125 MG tablet Commonly known as: COREG Take by mouth.   losartan 100 MG tablet Commonly known as: COZAAR Take 50 mg by mouth 2 (two) times daily.   Multivitamin Gummies Adult Chew Chew 1 tablet by mouth daily.   SUMAtriptan 100 MG tablet Commonly known as: IMITREX Take 100 mg by mouth every 2 (two) hours as needed for migraine. May repeat in 2 hours if headache persists or recurs.   venlafaxine XR 150 MG 24 hr capsule Commonly known as: EFFEXOR-XR Take 150 mg by mouth daily with  breakfast.       Allergies: No Known Allergies  Family History: History reviewed. No pertinent family history.  Social History:  reports that he quit smoking about 18 years ago. He has never used smokeless tobacco. He reports current alcohol use. No history on file for drug.  ROS: UROLOGY Frequent Urination?: Yes Hard to postpone urination?: Yes Burning/pain with urination?: No Get up at night to urinate?: Yes Leakage of urine?: No Urine stream starts and stops?: No Trouble starting stream?: No Do you have to strain to urinate?: No Blood in urine?: No Urinary tract infection?: No Sexually transmitted disease?: No Injury to kidneys or bladder?: No Painful intercourse?: No Weak stream?: Yes Erection problems?: No Penile pain?: No  Gastrointestinal Nausea?: No Vomiting?: No Indigestion/heartburn?: No Diarrhea?: No Constipation?: No  Constitutional Fever: No Night sweats?: No Weight loss?: No Fatigue?: No  Skin Skin rash/lesions?: No Itching?: No  Eyes Blurred vision?: No Double vision?: No  Ears/Nose/Throat Sore throat?: No Sinus problems?: No  Hematologic/Lymphatic Swollen glands?: No Easy bruising?: No  Cardiovascular Leg swelling?: No Chest pain?: No  Respiratory Cough?: No Shortness of breath?: No  Endocrine Excessive thirst?: No  Musculoskeletal Back pain?: No Joint pain?: No  Neurological Headaches?: No Dizziness?: No  Psychologic Depression?: No Anxiety?: No  Physical Exam: BP (!) 145/92   Pulse 73   Ht 6\' 4"  (1.93 m)   Wt 234 lb (106.1 kg)   BMI 28.48 kg/m   Constitutional:  Alert and oriented, No acute distress. HEENT:  AT, moist mucus membranes.  Trachea midline, no masses. Cardiovascular: No clubbing, cyanosis, or edema. Respiratory: Normal respiratory effort, no increased work of breathing. GI: Abdomen is soft, nontender, nondistended, no abdominal masses Skin: No rashes, bruises or suspicious lesions.  Neurologic: Grossly intact, no focal deficits, moving all 4 extremities. Psychiatric: Normal mood and affect.  Laboratory Data: Lab Results  Component Value Date   WBC 8.0 11/19/2017   HGB 10.8 (L) 11/19/2017   HCT 31.8 (L) 11/19/2017   MCV 91.7 11/19/2017   PLT 150 11/19/2017    Lab Results  Component Value Date   CREATININE 0.80 04/13/2019    As above   Pertinent Imaging: Present MRI reviewed again today, see epic  Assessment & Plan:    1. Prostate cancer [facility]) Newly diagnosed unfavorable intermediate risk prostate cancer  Staging via MRI shows no evidence of extraprostatic extension or metastatic disease  The patient was counseled about the natural history of prostate  cancer and the standard treatment options that are available for prostate cancer. It was explained to him how his age and life expectancy, clinical stage, Gleason score, and PSA affect his prognosis, the decision to proceed with additional staging studies, as well as how that information influences recommended treatment strategies. We discussed the roles for active surveillance, radiation therapy, surgical therapy, androgen deprivation, as well as ablative therapy options for the treatment of prostate cancer as appropriate to his individual cancer situation. We discussed the risks and benefits of these options with regard to their impact on cancer control and also in terms of potential adverse events, complications, and impact on quality of life particularly related to urinary, bowel, and sexual function. The patient was encouraged to ask questions throughout the discussion today and all questions were answered to his stated satisfaction. In addition, the patient was providedwith and/or directed to appropriate resources and literature for further education about prostate cancer treatment options.  Notably, his wife was present via speaker phone today.  She was disappointed that she was not able to physically be  present due to COVID-19 visitor restrictions.  We discussed surgical therapy for prostate cancer including the different available surgical approaches. We discussed, in detail, the risks and expectations of surgery with regard to cancer control, urinary control, and erectile dysfunction as well as expected post operative cover he processed. Additional risks of surgery including but not limited to bleeding, infection, hernia formation, nerve damage, fistula formation, bowel/recalt injury, potentially necessitating colostomy, damage to the urinary tract resulting in urinary leakage, urethral stricture, and cardiopulmonary risk such as myocardial infarction, stroke, death, thromboembolism etc. were explained. The risk of open surgical conversion for robotics/laparoscopic prostatectomy is also discussed.  He is leaning toward surgery as an option.  He is not interested in radiation as a treatment.  He may want a second opinion at a higher volume center.  Happy to revise him with some recommendations if he like to pursue this.  He will let us know if he like to book surgery here to [facility].  I recommended that if he would like to be scheduled for surgery, he be seen and evaluated by physical therapy preop.  Surgery will include bilateral lymphadenectomy based on his stratification.  We will plan for a left-sided nerve sparing procedure and probable partial nerve sparing on the right depending on intraoperative factors.  He is agreeable with this plan.   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

## 2019-05-18 ENCOUNTER — Telehealth: Payer: Self-pay | Admitting: Radiology

## 2019-05-18 ENCOUNTER — Other Ambulatory Visit: Payer: Self-pay | Admitting: Radiology

## 2019-05-18 DIAGNOSIS — C61 Malignant neoplasm of prostate: Secondary | ICD-10-CM

## 2019-05-18 NOTE — Telephone Encounter (Signed)
LMOM to return call. Need to discuss upcoming surgery with Dr Erlene Quan.

## 2019-05-19 NOTE — Telephone Encounter (Signed)
Discussed the [place] Surgery Information form below over the phone with wife.    [place], [place], [place] [address] Telephone: [phone] Fax: [phone]   Thank you for choosing [facility] for your upcoming surgery!  We are always here to assist in your urological needs.  Please read the following information with specific details for your upcoming appointments related to your surgery. Please contact Eduin Friedel at [phone] Option 3 with any questions.  The Name of Your Surgery: Robot assisted laparoscopic prostatectomy with bilateral pelvic lymph node dissection Your Surgery Date: 06/12/2019 Your Surgeon: Hollice Espy  Please call Same Day Surgery at [phone] between the hours of 1pm-3pm one day prior to your surgery. They will inform you of the time to arrive at Same Day Surgery which is located on the second floor of the [facility].   Please refer to the attached letter regarding instructions for Pre-Admission Testing. You will receive a call from the [place] office regarding your appointment with them.  The Pre-Admission Testing office is located at [place], on the first floor of the [facility] in [place] (office is to the right as you enter through the [employer] of the [facility]). Please have all medications you are currently taking and your insurance card available.  A COVID-19 test will be required prior to surgery and once test is performed you will need to remain in quarantine until the day of surgery. Patient was advised to have nothing to eat or drink after midnight the night prior to surgery except that he may have only water until 2 hours before surgery with nothing to drink within 2 hours of surgery.  The patient currently takes aspirin 81mg  & was informed to hold  medication for 7 days prior to surgery beginning on 06/05/2019. Patient's questions were answered and he expressed understanding of these instructions.

## 2019-05-24 ENCOUNTER — Telehealth: Payer: Self-pay | Admitting: Radiology

## 2019-05-24 NOTE — Telephone Encounter (Signed)
Surgical clearance from cardiologist DN Manuella Ghazi states patient must continue aspirin 81mg  through the perioperative period. He is scheduled for prostatectomy with bilateral pelvic lymph node dissection on 06/12/2019. Please advise.

## 2019-05-29 ENCOUNTER — Encounter: Payer: Self-pay | Admitting: Physical Therapy

## 2019-05-29 ENCOUNTER — Ambulatory Visit: Payer: Medicare PPO | Attending: Urology | Admitting: Physical Therapy

## 2019-05-29 ENCOUNTER — Other Ambulatory Visit: Payer: Self-pay

## 2019-05-29 DIAGNOSIS — R29898 Other symptoms and signs involving the musculoskeletal system: Secondary | ICD-10-CM | POA: Insufficient documentation

## 2019-05-29 DIAGNOSIS — R278 Other lack of coordination: Secondary | ICD-10-CM | POA: Diagnosis not present

## 2019-05-29 NOTE — Patient Instructions (Addendum)
(  handout bladder irritant/ water)   "not enough water" possibly 1 glass,  (32 floz) caffeinated tea with sweeteners, 24 fl oz  of coffee ,  56 fl oz of bladder irritants to 12 fl oz water  5:1    ____  Water increased from 12 fl oz to 36 fl oz (3 glasses)  Tea decreased from 32 fl oz to  24 floz  ( 2 glasses)   ____  Normal emptying of bladder is once every 2 hours in the daytime, 1 x per night.    _____   Deep core level 1 ( 10 reps, 3 x day)  ( handout)    _____   Avoid straining pelvic floor, abdominal muscles , spine  Use log rolling technique instead of getting out of bed with your neck or the sit-up     Log rolling into and out of bed   Log rolling into and out of bed If getting out of bed on R side, Bent knees, scoot hips/ shoulder to L  Raise R arm completely overhead, rolling onto armpit  Then lower bent knees to bed to get into complete side lying position  Then drop legs off bed, and push up onto R elbow/forearm, and use L hand to push onto the bed

## 2019-05-29 NOTE — Telephone Encounter (Signed)
Please clarify this issue.  Unclear why this medication needs to continue based on his PMH (no history of heart attack, stroke, hypercoagulable state, etc.     Hollice Espy, MD

## 2019-05-30 ENCOUNTER — Encounter: Payer: Self-pay | Admitting: Urology

## 2019-05-30 NOTE — Addendum Note (Signed)
Addended by: [name] on: 05/30/2019 02:39 PM   Modules accepted: Orders

## 2019-05-30 NOTE — Therapy (Addendum)
[place] MAIN [facility] SERVICES [address] [place], [place], [address] Phone: [phone]   Fax:  [phone]  Physical Therapy Evaluation  Patient Details  Name: Justin Tyler MRN: [id number] Date of Birth: 1956/02/03 Referring Provider (PT): Erlene Quan   Encounter Date: 05/29/2019  PT End of Session - 05/29/19 1717    Visit Number  1    Number of Visits  4    Date for PT Re-Evaluation  06/27/19    PT Start Time  [id number]    PT Stop Time  1820    PT Time Calculation (min)  78 min       Past Medical History:  Diagnosis Date  . Anxiety   . Arthritis   . Heart murmur   . History of hiatal hernia   . Hypertension     Past Surgical History:  Procedure Laterality Date  . TOTAL HIP ARTHROPLASTY Right   . TOTAL HIP ARTHROPLASTY Left 11/18/2017   Procedure: TOTAL HIP ARTHROPLASTY;  Surgeon: Corky Mull, MD;  Location: [facility] ORS;  Service: Orthopedics;  Laterality: Left;  . TOTAL HIP REVISION Right     There were no vitals filed for this visit.   Subjective Assessment - 05/29/19 1727    Subjective  Pt is scheduled for prostate surgery 06/23/19. Nocturia 3x sometimes. It is rare to sleep throught the night.  Drink teas before bed.  Pt's fitness routine includes walking dogs 30-45 min some hills, lifts dumbbells 10 lbs, used to do crunches but stop, swims breaststroke but has L shoulder issue and avoid freestyles.  Pt does not a flexibility program.  Retired for past 6 months.  Did not do any heavy lifting on the job in hobbies.  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  Hx of osteomyelitis as a child leading to [place] on R  when pt was 63 years old. Leg length was off leading to LBP.  Pt noticed he had always favored his L leg and it wore out leading to L THA.   Pt no longer favors his L leg and will bear weight on both legs equally.  Daily fluid intake; "not enough water" possibly 1 glass,  1/2 gallon of caffeinated tea with  sweeteners, 2 cups of coffee ,    Pertinent History  Denied LBP nor constipation or straining with bowel movements.         [facility] PT Assessment - 05/30/19 1423      Assessment   Medical Diagnosis  Prostate Cancer     Referring Provider (PT)  Erlene Quan      Precautions   Precautions  None      Restrictions   Weight Bearing Restrictions  No      Balance Screen   Has the patient fallen in the past 6 months  No      Observation/Other Assessments   Observations  ankles crossed, slumped      Coordination   Gross Motor Movements are Fluid and Coordinated  --   limited diaphragmatic excursion, chest breathing      Strength   Overall Strength Comments  knee flex 4-/5 B , knee flex/hip flex  5/5 B , hip abd 4-/5 B, hip ext 4-/5 B        Palpation   Spinal mobility  L sidebend ~45%, R 60% , rotation 60% B     SI assessment   L Ilic crest higher than R,  Bed Mobility   Bed Mobility  --   crunch method               Objective measurements completed on examination: See above findings.    Pelvic Floor Special Questions - 05/30/19 1424    Diastasis Recti  neg    External Perineal Exam  through clothing: Grade 3/5, limited lengthening of pelvic floor , abdominal overuse                     PT Long Term Goals - 05/30/19 1415      PT LONG TERM GOAL #1   Title  Pt will demo co-ordination of deep core with against gravity activities ( sit-to -stand, walking, bending) in order to minimize risk for injuries  pelvic floor dysfunction    Time  2    Period  Weeks      PT LONG TERM GOAL #2   Title  Pt will demo proper deep core coordination with proper diaphragmatic excursion in order to optimize postural stability and pelvic floor function    Time  2    Period  Weeks    Status  New    Target Date  06/13/19      PT LONG TERM GOAL #3   Title  Pt will demo dissassociation of trunk and pelvic/lower kinetic chain without cues in order to progress to use of  dumbbells/ resistance bands    Time  4    Period  Weeks    Status  New    Target Date  06/27/19      PT LONG TERM GOAL #4   Title  Pt will demo quick and endurance pelvic floor contractions IND in order to minimize urinary incontinence post surgery    Time  4    Period  Weeks    Status  New    Target Date  06/27/19      PT LONG TERM GOAL #5   Title  Pt will be IND with pelvic floor/ hip stretches to optimize pelvic floor function    Time  3    Period  Weeks    Status  New    Target Date  06/20/19             Plan - 05/30/19 1259    Clinical Impression Statement  Pt is a 63 yo male who is due for prostate surgery on 06/23/19 2/2 prostate cancer. Pt was referred to Pelvic Health PT to train his pelvic floor mm to yield better outcomes with continence post-surgery. Pt 's clinical presentations included signs of poor intraabdominal pressure system which is associated increased risk for urinary incontinence: _dyscoordination of deep core mm with abdominal mm overuse with pelvic floor contraction _lack of understanding on exercises that places less strain on the abdomen/pelvic floor.  _spinal mobility limitation _hip weakness/ limitation with Hx of B THA _poor understanding of bladder habits with high intake of bladder irritants and low intake of water   Pt was provided education on etiology of Sx with anatomy, physiology explanation with images along with the benefits of customized pelvic PT Tx based on pt's medical conditions and musculoskeletal deficits. Pt also required education on increasing water while decreasing bladder irritant in order to promote bladder health and minimizing risk for urinary frequency and urgency.  Discussed pt's risk factors for OSA and advised pt to f/u with PCP for sleep study to rule in/out OSA which can also impact his nocturia.  Pt would benefit from a regional interdependent approach to yield greater benefits with pelvic floor training given his Hx of  THA.             Stability/Clinical Decision Making  Evolving/Moderate complexity    Clinical Decision Making  Moderate    Rehab Potential  Good    PT Frequency  1x / week    PT Treatment/Interventions  Balance training;Neuromuscular re-education;Moist Heat;Traction;Patient/family education;Therapeutic activities;Therapeutic exercise;Manual techniques;Scar mobilization;Taping;ADLs/Self Care Home Management    Consulted and Agree with Plan of Care  Patient       Patient will benefit from skilled therapeutic intervention in order to improve the following deficits and impairments:  Improper body mechanics, Postural dysfunction, Decreased strength, Decreased endurance, Decreased knowledge of precautions, Decreased mobility, Decreased coordination, Increased muscle spasms, Decreased scar mobility, Increased fascial restricitons, Impaired flexibility, Hypomobility  Visit Diagnosis: Other lack of coordination  Other symptoms and signs involving the musculoskeletal system     Problem List Patient Active Problem List   Diagnosis Date Noted  . 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 [place], [place], [address] Phone: [phone]   Fax:  [phone]  Name: Reggy Dryer MRN: [id number] Date of Birth: 1955/11/07

## 2019-05-31 ENCOUNTER — Other Ambulatory Visit: Payer: Self-pay

## 2019-05-31 DIAGNOSIS — Z01818 Encounter for other preprocedural examination: Secondary | ICD-10-CM

## 2019-05-31 NOTE — Telephone Encounter (Signed)
Clarified with cardiologist, Dr Manuella Ghazi, that patient may stop aspirin indefinitely. Patient is aware.

## 2019-06-01 ENCOUNTER — Other Ambulatory Visit: Payer: Self-pay

## 2019-06-01 ENCOUNTER — Other Ambulatory Visit: Payer: 59

## 2019-06-01 DIAGNOSIS — Z01818 Encounter for other preprocedural examination: Secondary | ICD-10-CM

## 2019-06-01 LAB — MICROSCOPIC EXAMINATION
Bacteria, UA: NONE SEEN
Epithelial Cells (non renal): NONE SEEN /hpf (ref 0–10)

## 2019-06-01 LAB — URINALYSIS, COMPLETE
Bilirubin, UA: NEGATIVE
Glucose, UA: NEGATIVE
Ketones, UA: NEGATIVE
Leukocytes,UA: NEGATIVE
Nitrite, UA: NEGATIVE
Specific Gravity, UA: 1.025 (ref 1.005–1.030)
Urobilinogen, Ur: 0.2 mg/dL (ref 0.2–1.0)
pH, UA: 5.5 (ref 5.0–7.5)

## 2019-06-04 LAB — CULTURE, URINE COMPREHENSIVE

## 2019-06-05 ENCOUNTER — Encounter
Admission: RE | Admit: 2019-06-05 | Discharge: 2019-06-05 | Disposition: A | Discharge status: Home / Self Care | Payer: Medicare PPO | Source: Ambulatory Visit | Attending: Urology | Admitting: Urology

## 2019-06-05 ENCOUNTER — Other Ambulatory Visit: Payer: Self-pay | Admitting: Radiology

## 2019-06-05 ENCOUNTER — Other Ambulatory Visit: Payer: Self-pay

## 2019-06-05 DIAGNOSIS — I4510 Unspecified right bundle-branch block: Secondary | ICD-10-CM | POA: Diagnosis not present

## 2019-06-05 DIAGNOSIS — Z01810 Encounter for preprocedural cardiovascular examination: Secondary | ICD-10-CM | POA: Insufficient documentation

## 2019-06-05 DIAGNOSIS — I491 Atrial premature depolarization: Secondary | ICD-10-CM | POA: Insufficient documentation

## 2019-06-05 DIAGNOSIS — Z01812 Encounter for preprocedural laboratory examination: Secondary | ICD-10-CM | POA: Insufficient documentation

## 2019-06-05 HISTORY — DX: Malignant (primary) neoplasm, unspecified: C80.1

## 2019-06-05 HISTORY — DX: Headache, unspecified: R51.9

## 2019-06-05 LAB — BASIC METABOLIC PANEL
Anion gap: 5 (ref 5–15)
BUN: 18 mg/dL (ref 8–23)
CO2: 28 mmol/L (ref 22–32)
Calcium: 8.7 mg/dL — ABNORMAL LOW (ref 8.9–10.3)
Chloride: 107 mmol/L (ref 98–111)
Creatinine, Ser: 0.77 mg/dL (ref 0.61–1.24)
GFR calc Af Amer: >60 mL/min (ref >60)
GFR calc non Af Amer: >60 mL/min (ref >60)
Glucose, Bld: 97 mg/dL (ref 70–99)
Potassium: 3.8 mmol/L (ref 3.5–5.1)
Sodium: 140 mmol/L (ref 135–145)

## 2019-06-05 LAB — CBC
HCT: 40.1 % (ref 39.0–52.0)
Hemoglobin: 13.6 g/dL (ref 13.0–17.0)
MCH: 30.8 pg (ref 26.0–34.0)
MCHC: 33.9 g/dL (ref 30.0–36.0)
MCV: 90.7 fL (ref 80.0–100.0)
Platelets: 234 10*3/uL (ref 150–400)
RBC: 4.42 MIL/uL (ref 4.22–5.81)
RDW: 12.6 % (ref 11.5–15.5)
WBC: 5.8 10*3/uL (ref 4.0–10.5)
nRBC: 0 % (ref 0.0–0.2)

## 2019-06-05 LAB — TYPE AND SCREEN
ABO/RH(D): A POS
Antibody Screen: NEGATIVE

## 2019-06-05 LAB — PROTIME-INR
INR: 1 (ref 0.8–1.2)
Prothrombin Time: 13.3 seconds (ref 11.4–15.2)

## 2019-06-05 NOTE — Patient Instructions (Signed)
Your procedure is scheduled on: 06-12-19 MONDAY Report to Same Day Surgery 2nd floor medical mall [facility] Entrance-take elevator on left to 2nd floor.  Check in with surgery information desk.) To find out your arrival time please call [phone] between 1PM - 3PM on 06-09-19 FRIDAY  Remember: Instructions that are not followed completely may result in serious medical risk, up to and including death, or upon the discretion of your surgeon and anesthesiologist your surgery may need to be rescheduled.    _x___ 1. Do not eat food after midnight the night before your procedure. NO GUM OR CANDY AFTER MIDNIGHT. You may drink clear liquids up to 2 hours before you are scheduled to arrive at the hospital for your procedure.  Do not drink clear liquids within 2 hours of your scheduled arrival to the hospital.  Clear liquids include  --Water or Apple juice without pulp  --Gatorade  --Black Coffee or Clear Tea (No milk, no creamers, do not add anything to the coffee or Tea   ____Ensure clear carbohydrate drink on the way to the hospital for bariatric patients  ____Ensure clear carbohydrate drink 3 hours before surgery.     __x__ 2. No Alcohol for 24 hours before or after surgery.   __x__3. No Smoking or e-cigarettes for 24 prior to surgery.  Do not use any chewable tobacco products for at least 6 hour prior to surgery   ____  4. Bring all medications with you on the day of surgery if instructed.    __x__ 5. Notify your doctor if there is any change in your medical condition     (cold, fever, infections).    x___6. On the morning of surgery brush your teeth with toothpaste and water.  You may rinse your mouth with mouth wash if you wish.  Do not swallow any toothpaste or mouthwash.   Do not wear jewelry, make-up, hairpins, clips or nail polish.  Do not wear lotions, powders, or perfumes.   Do not shave 48 hours prior to surgery. Men may shave face and neck.  Do not bring valuables to the  hospital.    [facility] is not responsible for any belongings or valuables.               Contacts, dentures or bridgework may not be worn into surgery.  Leave your suitcase in the car. After surgery it may be brought to your room.  For patients admitted to the hospital, discharge time is determined by your  treatment team.  _  Patients discharged the day of surgery will not be allowed to drive home.  You will need someone to drive you home and stay with you the night of your procedure.    Please read over the following fact sheets that you were given:   [facility] Preparing for Surgery and or MRSA Information   _x___ TAKE THE FOLLOWING MEDICATION THE MORNING OF SURGERY WITH A SMALL SIP OF WATER. These include:  1. COREG (CARVEDILOL)  2. EFFEXOR (VENLAFAXINE)  3.  4.  5.  6.  ____Fleets enema or Magnesium Citrate as directed.   _x___ Use CHG Soap or sage wipes as directed on instruction sheet   ____ Use inhalers on the day of surgery and bring to hospital day of surgery  ____ Stop Metformin and Janumet 2 days prior to surgery.    ____ Take 1/2 of usual insulin dose the night before surgery and none on the morning  surgery.  ____ Follow recommendations from Cardiologist, Pulmonologist or PCP regarding stopping Aspirin, Coumadin, Plavix ,Eliquis, Effient, or Pradaxa, and Pletal.  X____Stop Anti-inflammatories such as Advil, Aleve, Ibuprofen, Motrin, Naproxen,MELOXICAM (MOBIC), Naprosyn, Goodies powders or aspirin products NOW-OK to take Tylenol    ____ Stop supplements until after surgery.    ____ Bring C-Pap to the hospital.

## 2019-06-07 ENCOUNTER — Encounter: Payer: Medicare PPO | Admitting: Physical Therapy

## 2019-06-08 ENCOUNTER — Other Ambulatory Visit: Payer: Self-pay

## 2019-06-08 ENCOUNTER — Other Ambulatory Visit
Admission: RE | Admit: 2019-06-08 | Discharge: 2019-06-08 | Disposition: A | Discharge status: Home / Self Care | Payer: Medicare PPO | Source: Ambulatory Visit | Attending: Urology | Admitting: Urology

## 2019-06-08 ENCOUNTER — Ambulatory Visit: Payer: Medicare PPO | Admitting: Physical Therapy

## 2019-06-08 DIAGNOSIS — Z01812 Encounter for preprocedural laboratory examination: Secondary | ICD-10-CM | POA: Diagnosis present

## 2019-06-08 DIAGNOSIS — R29898 Other symptoms and signs involving the musculoskeletal system: Secondary | ICD-10-CM

## 2019-06-08 DIAGNOSIS — Z20828 Contact with and (suspected) exposure to other viral communicable diseases: Secondary | ICD-10-CM | POA: Insufficient documentation

## 2019-06-08 DIAGNOSIS — R278 Other lack of coordination: Secondary | ICD-10-CM | POA: Diagnosis not present

## 2019-06-08 NOTE — Patient Instructions (Addendum)
Daily stretches: _ happy baby pose  5 breaths  _open book ( handout)   15 reps    _at the kitchen counter:  minisquat position , hands stretched out at counter,  Lengthen at the side by moving hands to one side   5 breaths    ____  Before surgery: Laying down  X 2 /day   Quick pelvic floor contractions 10 reps   Deep core level 2 ( 30 reps)  for abdominal, diaphragm, pelvic floor working as a team  Pelvic floor long holds: 3 sec holds, 3 reps counting aloud with 2 rest breathing between squeezes  ( and also seated at 5 different times in the day: FEET ON FLOOR, proper posture and less chest breathing)    ____  While catheter is in:  deep core level 1-2 (breathing 10 reps, knee out 30 reps x 2 day)   no pelvic floor squeezes     After surgery and catheter is removed: Resume with deep core level 1.  With  pelvic floor  1 quick suqeeze without upper ab muscles stealing the show   exhale  squeeze below, lower ab sinks, upper ab sinks    3 x time    _____            **ALSO SQUEEZE BEFORE YOUR SNEEZE, COUGH, LAUGH to decrease downward pressure   **ALSO EXHALE BEFORE YOU RISE AGAINST GRAVITY (lifting, sit to stand, from squat to stand)     Proper body mechanics with getting out of a chair to decrease strain  on back &pelvic floor   Avoid holding your breath when Getting out of the chair:  Scoot to front part of chair chair Heels behind feet, feet are hip width apart, nose over toes  Inhale like you are smelling roses Exhale to stand

## 2019-06-09 LAB — SARS CORONAVIRUS 2 (TAT 6-24 HRS): SARS Coronavirus 2: NEGATIVE

## 2019-06-09 NOTE — Therapy (Signed)
[place] MAIN [facility] SERVICES [address] [place], [place], [address] Phone: [phone]   Fax:  [phone]  Physical Therapy Treatment/Discharge Summary   Patient Details  Name: Justin Tyler MRN: [id number] Date of Birth: 11/01/1955 Referring Provider (PT): Erlene Quan   Encounter Date: 06/08/2019  PT End of Session - 06/09/19 0819    Visit Number  2    Number of Visits  4    Date for PT Re-Evaluation  06/27/19    PT Start Time  0900    PT Stop Time  1000    PT Time Calculation (min)  60 min       Past Medical History:  Diagnosis Date  . Anxiety   . Arthritis   . Cancer ([place])   . Headache    h/o migraines  . Heart murmur    asymptomatic  . Hypertension     Past Surgical History:  Procedure Laterality Date  . JOINT REPLACEMENT    . TOTAL HIP ARTHROPLASTY Right   . TOTAL HIP ARTHROPLASTY Left 11/18/2017   Procedure: TOTAL HIP ARTHROPLASTY;  Surgeon: Corky Mull, MD;  Location: [facility] ORS;  Service: Orthopedics;  Laterality: Left;  . TOTAL HIP REVISION Right     There were no vitals filed for this visit.  Subjective Assessment - 06/08/19 0901    Subjective  Pt reported he started to drink more water.    Pertinent History  Denied LBP nor constipation or straining with bowel movements.         [facility] PT Assessment - 06/09/19 0853      Palpation   Spinal mobility  L lat, lateral intercostals, SA tight on L                 Pelvic Floor Special Questions - 06/09/19 0820    External Perineal Exam  through clothing: tightness at L pelvic floor obt int . Dyscoordination with ab overuse. limited pelvic floor upward movement, defererd long hold contractions         [facility] Adult PT Treatment/Exercise - 06/09/19 0820      Neuro Re-ed    Neuro Re-ed Details   excessive cues for stretches and deep core , pelvic floor coordinaiton without overuse of ab      Manual Therapy   Manual therapy comments  STM/MWM along L  mid  throacic, lat, medial scapula ,  L ischioanal fossa                   PT Long Term Goals - 05/30/19 1415      PT LONG TERM GOAL #1   Title  Pt will demo co-ordination of deep core with against gravity activities ( sit-to -stand, walking, bending) in order to minimize risk for injuries  pelvic floor dysfunction    Time  2    Period  Weeks       Period  Achieved      PT LONG TERM GOAL #2   Title  Pt will demo proper deep core coordination with proper diaphragmatic excursion in order to optimize postural stability and pelvic floor function    Time  2    Period  Weeks    Status  Achieved   Target Date  06/13/19      PT LONG TERM GOAL #3   Title  Pt will demo dissassociation of trunk and pelvic/lower kinetic chain without cues in order to progress to use of dumbbells/ resistance bands  Time  4    Period  Weeks    Status Not met   Target Date  06/27/19      PT LONG TERM GOAL #4   Title  Pt will demo quick and endurance pelvic floor contractions IND in order to minimize urinary incontinence post surgery    Time  4    Period  Weeks    Status  Partially Met     Target Date  06/27/19      PT LONG TERM GOAL #5   Title  Pt will be IND with pelvic floor/ hip stretches to optimize pelvic floor function    Time  3    Period  Weeks    Status  Partially Met    Target Date  06/20/19            Plan - 06/09/19 1696    Clinical Impression Statement  Pt is read for d/c as pt's prostate surgery has been moved to an earlier date 06/12/19. Pt achieved 2 goals, partially met 2 goals, and not yet met one goal. Pt demo'd decreased L spinal mm and pelvic floor tightness after manual Tx which helped pt to achieved more diaphragmatic, pelvic floor lengthening which in turn elicited a stronger contraction. Pt was not ready to progress to endurance contraction pelvic floor exercises because of overactivity of pelvic floor mm. Pt demo'd improved coordination of deep coordination with less  abdominal mmm overuse, better understanding of body mechanics to no bear down on pelvic floor. These improvements will help pt have better outcomes with urinary continence and pelvic floor retraining post surgery. Pt will benefit from further pelvic PT after his surgery to promote greater outcomes related to surgery and return to his fitness routine with less risk for injuries/ pelvic floor dysfunctions.      Stability/Clinical Decision Making  Evolving/Moderate complexity    Rehab Potential  Good    PT Frequency  1x / week    PT Treatment/Interventions  Balance training;Neuromuscular re-education;Moist Heat;Traction;Patient/family education;Therapeutic activities;Therapeutic exercise;Manual techniques;Scar mobilization;Taping;ADLs/Self Care Home Management    Consulted and Agree with Plan of Care  Patient       Patient will benefit from skilled therapeutic intervention in order to improve the following deficits and impairments:  Improper body mechanics, Postural dysfunction, Decreased strength, Decreased endurance, Decreased knowledge of precautions, Decreased mobility, Decreased coordination, Increased muscle spasms, Decreased scar mobility, Increased fascial restricitons, Impaired flexibility, Hypomobility  Visit Diagnosis: Other lack of coordination  Other symptoms and signs involving the musculoskeletal system     Problem List Patient Active Problem List   Diagnosis Date Noted  . 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 [place], [place], [address] Phone: [phone]   Fax:  [phone]  Name: Justin Tyler MRN: [id number] Date of Birth: 1956-02-07

## 2019-06-11 MED ORDER — CEFAZOLIN SODIUM-DEXTROSE 2-4 GM/100ML-% IV SOLN
2.0000 g | INTRAVENOUS | Status: AC
  Administered 2019-06-12: 2 g via INTRAVENOUS

## 2019-06-12 ENCOUNTER — Encounter: Payer: Self-pay | Admitting: Anesthesiology

## 2019-06-12 ENCOUNTER — Other Ambulatory Visit: Payer: Self-pay

## 2019-06-12 ENCOUNTER — Encounter: Payer: Medicare PPO | Admitting: Physical Therapy

## 2019-06-12 ENCOUNTER — Observation Stay
Admission: AD | Admit: 2019-06-12 | Discharge: 2019-06-13 | Disposition: A | Discharge status: Home / Self Care | Payer: Medicare PPO | Attending: Urology | Admitting: Urology

## 2019-06-12 ENCOUNTER — Ambulatory Visit: Payer: Medicare PPO | Admitting: Anesthesiology

## 2019-06-12 ENCOUNTER — Encounter
Admission: AD | Disposition: A | Discharge status: Home / Self Care | Payer: Self-pay | Source: Home / Self Care | Attending: Urology

## 2019-06-12 DIAGNOSIS — N529 Male erectile dysfunction, unspecified: Secondary | ICD-10-CM | POA: Diagnosis not present

## 2019-06-12 DIAGNOSIS — Z23 Encounter for immunization: Secondary | ICD-10-CM | POA: Insufficient documentation

## 2019-06-12 DIAGNOSIS — Z96643 Presence of artificial hip joint, bilateral: Secondary | ICD-10-CM | POA: Diagnosis not present

## 2019-06-12 DIAGNOSIS — I771 Stricture of artery: Secondary | ICD-10-CM | POA: Insufficient documentation

## 2019-06-12 DIAGNOSIS — Z87891 Personal history of nicotine dependence: Secondary | ICD-10-CM | POA: Insufficient documentation

## 2019-06-12 DIAGNOSIS — Z7982 Long term (current) use of aspirin: Secondary | ICD-10-CM | POA: Diagnosis not present

## 2019-06-12 DIAGNOSIS — I10 Essential (primary) hypertension: Secondary | ICD-10-CM | POA: Insufficient documentation

## 2019-06-12 DIAGNOSIS — C61 Malignant neoplasm of prostate: Principal | ICD-10-CM | POA: Insufficient documentation

## 2019-06-12 DIAGNOSIS — Z79899 Other long term (current) drug therapy: Secondary | ICD-10-CM | POA: Insufficient documentation

## 2019-06-12 DIAGNOSIS — G43909 Migraine, unspecified, not intractable, without status migrainosus: Secondary | ICD-10-CM | POA: Insufficient documentation

## 2019-06-12 HISTORY — PX: ROBOT ASSISTED LAPAROSCOPIC RADICAL PROSTATECTOMY: SHX5141

## 2019-06-12 HISTORY — PX: PELVIC LYMPH NODE DISSECTION: SHX6543

## 2019-06-12 SURGERY — PROSTATECTOMY, RADICAL, ROBOT-ASSISTED, LAPAROSCOPIC
Anesthesia: General

## 2019-06-12 MED ORDER — PROPOFOL 10 MG/ML IV BOLUS
INTRAVENOUS | Status: AC
  Filled 2019-06-12: qty 20

## 2019-06-12 MED ORDER — ONDANSETRON HCL 4 MG/2ML IJ SOLN
INTRAMUSCULAR | Status: AC
  Filled 2019-06-12: qty 2

## 2019-06-12 MED ORDER — ROCURONIUM BROMIDE 100 MG/10ML IV SOLN
INTRAVENOUS | Status: DC | PRN
  Administered 2019-06-12: 20 mg via INTRAVENOUS
  Administered 2019-06-12: 30 mg via INTRAVENOUS
  Administered 2019-06-12: 50 mg via INTRAVENOUS
  Administered 2019-06-12 (×2): 20 mg via INTRAVENOUS
  Administered 2019-06-12: 30 mg via INTRAVENOUS

## 2019-06-12 MED ORDER — DOCUSATE SODIUM 100 MG PO CAPS
100.0000 mg | ORAL_CAPSULE | Freq: Two times a day (BID) | ORAL | Status: DC
  Administered 2019-06-12 – 2019-06-13 (×3): 100 mg via ORAL
  Filled 2019-06-12 (×3): qty 1

## 2019-06-12 MED ORDER — LOSARTAN POTASSIUM 50 MG PO TABS
50.0000 mg | ORAL_TABLET | Freq: Two times a day (BID) | ORAL | Status: DC
  Administered 2019-06-12 – 2019-06-13 (×2): 50 mg via ORAL
  Filled 2019-06-12 (×2): qty 1

## 2019-06-12 MED ORDER — THROMBIN 5000 UNITS EX SOLR
CUTANEOUS | Status: AC
  Filled 2019-06-12: qty 5000

## 2019-06-12 MED ORDER — MIDAZOLAM HCL 2 MG/2ML IJ SOLN
INTRAMUSCULAR | Status: AC
  Filled 2019-06-12: qty 2

## 2019-06-12 MED ORDER — SUGAMMADEX SODIUM 200 MG/2ML IV SOLN
INTRAVENOUS | Status: AC
  Filled 2019-06-12: qty 2

## 2019-06-12 MED ORDER — LIDOCAINE HCL 4 % MT SOLN
OROMUCOSAL | Status: DC | PRN
  Administered 2019-06-12: 4 mL via TOPICAL

## 2019-06-12 MED ORDER — DIPHENHYDRAMINE HCL 12.5 MG/5ML PO ELIX
12.5000 mg | ORAL_SOLUTION | Freq: Four times a day (QID) | ORAL | Status: DC | PRN
  Filled 2019-06-12: qty 5

## 2019-06-12 MED ORDER — CEFAZOLIN SODIUM-DEXTROSE 2-4 GM/100ML-% IV SOLN
INTRAVENOUS | Status: AC
  Filled 2019-06-12: qty 100

## 2019-06-12 MED ORDER — SUGAMMADEX SODIUM 500 MG/5ML IV SOLN
INTRAVENOUS | Status: DC | PRN
  Administered 2019-06-12: 214 mg via INTRAVENOUS

## 2019-06-12 MED ORDER — ONDANSETRON HCL 4 MG/2ML IJ SOLN
INTRAMUSCULAR | Status: DC | PRN
  Administered 2019-06-12 (×2): 4 mg via INTRAVENOUS

## 2019-06-12 MED ORDER — ZOLPIDEM TARTRATE 5 MG PO TABS: 5.0000 mg | ORAL_TABLET | Freq: Every evening | ORAL | Status: DC | PRN

## 2019-06-12 MED ORDER — INFLUENZA VAC A&B SA ADJ QUAD 0.5 ML IM PRSY
0.5000 mL | PREFILLED_SYRINGE | INTRAMUSCULAR | Status: AC
  Administered 2019-06-13: 15:00:00 0.5 mL via INTRAMUSCULAR
  Filled 2019-06-12: qty 0.5

## 2019-06-12 MED ORDER — OXYCODONE-ACETAMINOPHEN 5-325 MG PO TABS
1.0000 | ORAL_TABLET | ORAL | Status: DC | PRN
  Administered 2019-06-12 (×2): 1 via ORAL
  Administered 2019-06-12 – 2019-06-13 (×2): 2 via ORAL
  Filled 2019-06-12 (×3): qty 1
  Filled 2019-06-12: qty 2
  Filled 2019-06-12: qty 1

## 2019-06-12 MED ORDER — FENTANYL CITRATE (PF) 250 MCG/5ML IJ SOLN
INTRAMUSCULAR | Status: DC | PRN
  Administered 2019-06-12: 50 ug via INTRAVENOUS
  Administered 2019-06-12 (×2): 100 ug via INTRAVENOUS
  Administered 2019-06-12: 50 ug via INTRAVENOUS

## 2019-06-12 MED ORDER — ACETAMINOPHEN 10 MG/ML IV SOLN
INTRAVENOUS | Status: DC | PRN
  Administered 2019-06-12: 1000 mg via INTRAVENOUS

## 2019-06-12 MED ORDER — ROCURONIUM BROMIDE 50 MG/5ML IV SOLN
INTRAVENOUS | Status: AC
  Filled 2019-06-12: qty 1

## 2019-06-12 MED ORDER — ACETAMINOPHEN 10 MG/ML IV SOLN
INTRAVENOUS | Status: AC
  Filled 2019-06-12: qty 100

## 2019-06-12 MED ORDER — GLYCOPYRROLATE 0.2 MG/ML IJ SOLN
INTRAMUSCULAR | Status: AC
  Filled 2019-06-12: qty 1

## 2019-06-12 MED ORDER — MIDAZOLAM HCL 2 MG/2ML IJ SOLN
INTRAMUSCULAR | Status: DC | PRN
  Administered 2019-06-12: 2 mg via INTRAVENOUS

## 2019-06-12 MED ORDER — CARVEDILOL 3.125 MG PO TABS
3.1250 mg | ORAL_TABLET | Freq: Two times a day (BID) | ORAL | Status: DC
  Administered 2019-06-12 – 2019-06-13 (×2): 3.125 mg via ORAL
  Filled 2019-06-12 (×3): qty 1

## 2019-06-12 MED ORDER — FENTANYL CITRATE (PF) 100 MCG/2ML IJ SOLN
25.0000 ug | INTRAMUSCULAR | Status: DC | PRN
  Administered 2019-06-12: 25 ug via INTRAVENOUS

## 2019-06-12 MED ORDER — ONDANSETRON HCL 4 MG/2ML IJ SOLN: 4.0000 mg | INTRAMUSCULAR | Status: DC | PRN

## 2019-06-12 MED ORDER — FENTANYL CITRATE (PF) 100 MCG/2ML IJ SOLN
INTRAMUSCULAR | Status: AC
  Filled 2019-06-12: qty 2

## 2019-06-12 MED ORDER — BUPIVACAINE HCL 0.5 % IJ SOLN
INTRAMUSCULAR | Status: DC | PRN
  Administered 2019-06-12: 30 mL

## 2019-06-12 MED ORDER — DEXAMETHASONE SODIUM PHOSPHATE 10 MG/ML IJ SOLN
INTRAMUSCULAR | Status: AC
  Filled 2019-06-12: qty 1

## 2019-06-12 MED ORDER — FENTANYL CITRATE (PF) 250 MCG/5ML IJ SOLN
INTRAMUSCULAR | Status: AC
  Filled 2019-06-12: qty 5

## 2019-06-12 MED ORDER — SODIUM CHLORIDE 0.9 % IV SOLN
INTRAVENOUS | Status: DC
  Administered 2019-06-12 – 2019-06-13 (×2): via INTRAVENOUS

## 2019-06-12 MED ORDER — HYDRALAZINE HCL 20 MG/ML IJ SOLN
INTRAMUSCULAR | Status: AC
  Filled 2019-06-12: qty 1

## 2019-06-12 MED ORDER — VENLAFAXINE HCL ER 75 MG PO CP24
225.0000 mg | ORAL_CAPSULE | Freq: Every day | ORAL | Status: DC
  Administered 2019-06-13: 225 mg via ORAL
  Filled 2019-06-12: qty 3

## 2019-06-12 MED ORDER — SODIUM CHLORIDE 0.9 % IV BOLUS
1000.0000 mL | Freq: Once | INTRAVENOUS | Status: AC
  Administered 2019-06-12: 1000 mL via INTRAVENOUS

## 2019-06-12 MED ORDER — DEXAMETHASONE SODIUM PHOSPHATE 10 MG/ML IJ SOLN
INTRAMUSCULAR | Status: DC | PRN
  Administered 2019-06-12: 10 mg via INTRAVENOUS

## 2019-06-12 MED ORDER — LIDOCAINE HCL (CARDIAC) PF 100 MG/5ML IV SOSY
PREFILLED_SYRINGE | INTRAVENOUS | Status: DC | PRN
  Administered 2019-06-12: 60 mg via INTRATRACHEAL

## 2019-06-12 MED ORDER — FAMOTIDINE 20 MG PO TABS
ORAL_TABLET | ORAL | Status: AC
  Administered 2019-06-12: 20 mg via ORAL
  Filled 2019-06-12: qty 1

## 2019-06-12 MED ORDER — OXYCODONE HCL 5 MG/5ML PO SOLN: 5.0000 mg | Freq: Once | ORAL | Status: DC | PRN

## 2019-06-12 MED ORDER — ACETAMINOPHEN 325 MG PO TABS: 650.0000 mg | ORAL_TABLET | ORAL | Status: DC | PRN

## 2019-06-12 MED ORDER — ATORVASTATIN CALCIUM 10 MG PO TABS
10.0000 mg | ORAL_TABLET | Freq: Every day | ORAL | Status: DC
  Administered 2019-06-12: 10 mg via ORAL
  Filled 2019-06-12: qty 1

## 2019-06-12 MED ORDER — SUMATRIPTAN SUCCINATE 50 MG PO TABS
100.0000 mg | ORAL_TABLET | ORAL | Status: DC | PRN
  Administered 2019-06-12 (×2): 100 mg via ORAL
  Filled 2019-06-12 (×3): qty 2

## 2019-06-12 MED ORDER — THROMBIN 5000 UNITS EX SOLR
CUTANEOUS | Status: DC | PRN
  Administered 2019-06-12: 5000 [IU] via TOPICAL

## 2019-06-12 MED ORDER — OXYBUTYNIN CHLORIDE 5 MG PO TABS: 5.0000 mg | ORAL_TABLET | Freq: Three times a day (TID) | ORAL | Status: DC | PRN

## 2019-06-12 MED ORDER — LACTATED RINGERS IV SOLN
INTRAVENOUS | Status: DC | PRN
  Administered 2019-06-12: 09:00:00 via INTRAVENOUS

## 2019-06-12 MED ORDER — AMLODIPINE BESYLATE 5 MG PO TABS
5.0000 mg | ORAL_TABLET | Freq: Every evening | ORAL | Status: DC
  Administered 2019-06-12: 5 mg via ORAL
  Filled 2019-06-12: qty 1

## 2019-06-12 MED ORDER — HYDRALAZINE HCL 20 MG/ML IJ SOLN
INTRAMUSCULAR | Status: DC | PRN
  Administered 2019-06-12 (×4): 4 mg via INTRAVENOUS

## 2019-06-12 MED ORDER — BUPIVACAINE HCL (PF) 0.5 % IJ SOLN
INTRAMUSCULAR | Status: AC
  Filled 2019-06-12: qty 30

## 2019-06-12 MED ORDER — PROPOFOL 10 MG/ML IV BOLUS
INTRAVENOUS | Status: DC | PRN
  Administered 2019-06-12: 200 mg via INTRAVENOUS

## 2019-06-12 MED ORDER — MORPHINE SULFATE (PF) 2 MG/ML IV SOLN: 2.0000 mg | INTRAVENOUS | Status: DC | PRN

## 2019-06-12 MED ORDER — BELLADONNA ALKALOIDS-OPIUM 16.2-60 MG RE SUPP: 1.0000 | Freq: Four times a day (QID) | RECTAL | Status: DC | PRN

## 2019-06-12 MED ORDER — DIPHENHYDRAMINE HCL 50 MG/ML IJ SOLN: 12.5000 mg | Freq: Four times a day (QID) | INTRAMUSCULAR | Status: DC | PRN

## 2019-06-12 MED ORDER — PHENYLEPHRINE HCL (PRESSORS) 10 MG/ML IV SOLN
INTRAVENOUS | Status: AC
  Filled 2019-06-12: qty 1

## 2019-06-12 MED ORDER — LIDOCAINE HCL (PF) 2 % IJ SOLN
INTRAMUSCULAR | Status: AC
  Filled 2019-06-12: qty 10

## 2019-06-12 MED ORDER — FAMOTIDINE 20 MG PO TABS
20.0000 mg | ORAL_TABLET | Freq: Once | ORAL | Status: AC
  Administered 2019-06-12: 08:00:00 20 mg via ORAL

## 2019-06-12 MED ORDER — LACTATED RINGERS IV SOLN
INTRAVENOUS | Status: DC
  Administered 2019-06-12: 08:00:00 via INTRAVENOUS

## 2019-06-12 MED ORDER — GLYCOPYRROLATE 0.2 MG/ML IJ SOLN
INTRAMUSCULAR | Status: DC | PRN
  Administered 2019-06-12: 0.2 mg via INTRAVENOUS

## 2019-06-12 MED ORDER — ROCURONIUM BROMIDE 50 MG/5ML IV SOLN
INTRAVENOUS | Status: AC
  Filled 2019-06-12: qty 2

## 2019-06-12 MED ORDER — HEPARIN SODIUM (PORCINE) 5000 UNIT/ML IJ SOLN
5000.0000 [IU] | Freq: Three times a day (TID) | INTRAMUSCULAR | Status: DC
  Administered 2019-06-12 – 2019-06-13 (×3): 5000 [IU] via SUBCUTANEOUS
  Filled 2019-06-12 (×3): qty 1

## 2019-06-12 MED ORDER — CEFAZOLIN SODIUM-DEXTROSE 1-4 GM/50ML-% IV SOLN
1.0000 g | Freq: Three times a day (TID) | INTRAVENOUS | Status: AC
  Administered 2019-06-12 – 2019-06-13 (×2): 1 g via INTRAVENOUS
  Filled 2019-06-12 (×2): qty 50

## 2019-06-12 MED ORDER — OXYCODONE HCL 5 MG PO TABS: 5.0000 mg | ORAL_TABLET | Freq: Once | ORAL | Status: DC | PRN

## 2019-06-12 SURGICAL SUPPLY — 103 items
ANCHOR TIS RET SYS 235ML (MISCELLANEOUS) ×3 IMPLANT
APPLICATOR SURGIFLO ENDO (HEMOSTASIS) ×3 IMPLANT
APPLIER CLIP LOGIC TI 5 (MISCELLANEOUS) IMPLANT
BAG URINE DRAINAGE (UROLOGICAL SUPPLIES) ×3 IMPLANT
BLADE CLIPPER SURG (BLADE) ×3 IMPLANT
BULB RESERV EVAC DRAIN JP 100C (MISCELLANEOUS) IMPLANT
CANISTER SUCT 1200ML W/VALVE (MISCELLANEOUS) ×3 IMPLANT
CATH DRAINAGE MALECOT 26FR (CATHETERS) ×2 IMPLANT
CATH FOL 2WAY LX 18X30 (CATHETERS) ×3 IMPLANT
CATH FOL 2WAY LX 18X5 (CATHETERS) ×6 IMPLANT
CATH MALECOT (CATHETERS) ×3
CHLORAPREP W/TINT 26 (MISCELLANEOUS) ×6 IMPLANT
CLIP SUT LAPRA TY ABSORB (SUTURE) IMPLANT
CLIP VESOLOCK LG 6/CT PURPLE (CLIP) ×6 IMPLANT
CORD BIP STRL DISP 12FT (MISCELLANEOUS) ×3 IMPLANT
CORD MONOPOLAR M/FML 12FT (MISCELLANEOUS) IMPLANT
COVER TIP SHEARS 8 DVNC (MISCELLANEOUS) ×2 IMPLANT
COVER TIP SHEARS 8MM DA VINCI (MISCELLANEOUS) ×1
COVER WAND RF STERILE (DRAPES) ×3 IMPLANT
DEFOGGER SCOPE WARMER CLEARIFY (MISCELLANEOUS) ×3 IMPLANT
DERMABOND ADVANCED (GAUZE/BANDAGES/DRESSINGS) ×2
DERMABOND ADVANCED .7 DNX12 (GAUZE/BANDAGES/DRESSINGS) ×4 IMPLANT
DRAIN CHANNEL JP 15F RND 16 (MISCELLANEOUS) IMPLANT
DRAIN CHANNEL JP 19F (MISCELLANEOUS) IMPLANT
DRAPE 3/4 80X56 (DRAPES) ×3 IMPLANT
DRAPE ARM DVNC X/XI (DISPOSABLE) ×2 IMPLANT
DRAPE COLUMN DVNC XI (DISPOSABLE) ×2 IMPLANT
DRAPE DA VINCI XI ARM (DISPOSABLE) ×1
DRAPE DA VINCI XI COLUMN (DISPOSABLE) ×1
DRAPE LEGGINS SURG 28X43 STRL (DRAPES) ×3 IMPLANT
DRAPE SURG 17X11 SM STRL (DRAPES) ×12 IMPLANT
DRAPE UNDER BUTTOCK W/FLU (DRAPES) ×3 IMPLANT
DRIVER LRG NEEDLE DA VINCI (INSTRUMENTS) ×2
DRIVER NDLE LRG DVNC (INSTRUMENTS) ×4 IMPLANT
DRSG TELFA 3X8 NADH (GAUZE/BANDAGES/DRESSINGS) ×3 IMPLANT
ELECT REM PT RETURN 9FT ADLT (ELECTROSURGICAL) ×3
ELECTRODE REM PT RTRN 9FT ADLT (ELECTROSURGICAL) ×2 IMPLANT
FORCEPS MARYLAND BIPOLAR 8X55 (INSTRUMENTS) ×1
FORCEPS MRYLND BPLR 8X55 DVNC (INSTRUMENTS) ×2 IMPLANT
GLOVE BIO SURGEON STRL SZ 6.5 (GLOVE) ×9 IMPLANT
GLOVE BIOGEL PI IND STRL 7.5 (GLOVE) ×2 IMPLANT
GLOVE BIOGEL PI INDICATOR 7.5 (GLOVE) ×1
GOWN STRL REUS W/ TWL LRG LVL3 (GOWN DISPOSABLE) ×6 IMPLANT
GOWN STRL REUS W/ TWL XL LVL3 (GOWN DISPOSABLE) ×2 IMPLANT
GOWN STRL REUS W/TWL LRG LVL3 (GOWN DISPOSABLE) ×3
GOWN STRL REUS W/TWL XL LVL3 (GOWN DISPOSABLE) ×1
GRASPER SUT TROCAR 14GX15 (MISCELLANEOUS) ×3 IMPLANT
HEMOSTAT SURGICEL 2X14 (HEMOSTASIS) ×3 IMPLANT
HOLDER FOLEY CATH W/STRAP (MISCELLANEOUS) ×3 IMPLANT
IRRIGATION STRYKERFLOW (MISCELLANEOUS) ×2 IMPLANT
IRRIGATOR STRYKERFLOW (MISCELLANEOUS) ×3
IV LACTATED RINGERS 1000ML (IV SOLUTION) ×3 IMPLANT
KIT PINK PAD W/HEAD ARE REST (MISCELLANEOUS) ×3
KIT PINK PAD W/HEAD ARM REST (MISCELLANEOUS) ×2 IMPLANT
LABEL OR SOLS (LABEL) ×3 IMPLANT
MARKER SKIN DUAL TIP RULER LAB (MISCELLANEOUS) ×3 IMPLANT
NEEDLE HYPO 22GX1.5 SAFETY (NEEDLE) ×3 IMPLANT
NEEDLE INSUFFLATION 14GA 120MM (NEEDLE) ×3 IMPLANT
NS IRRIG 500ML POUR BTL (IV SOLUTION) ×3 IMPLANT
OBTURATOR OPTICAL STANDARD 8MM (TROCAR) ×1
OBTURATOR OPTICAL STND 8 DVNC (TROCAR) ×2
OBTURATOR OPTICALSTD 8 DVNC (TROCAR) ×2 IMPLANT
PACK LAP CHOLECYSTECTOMY (MISCELLANEOUS) ×3 IMPLANT
PENCIL ELECTRO HAND CTR (MISCELLANEOUS) ×3 IMPLANT
PROGRASP ENDOWRIST DA VINCI (INSTRUMENTS) ×1
PROGRASP ENDOWRIST DVNC (INSTRUMENTS) ×2 IMPLANT
RELOAD STAPLER WHITE 60MM (STAPLE) ×2 IMPLANT
SEAL CANN UNIV 5-8 DVNC XI (MISCELLANEOUS) ×8 IMPLANT
SEAL XI 5MM-8MM UNIVERSAL (MISCELLANEOUS) ×4
SET TUBE SMOKE EVAC HIGH FLOW (TUBING) ×3 IMPLANT
SLEEVE ENDOPATH XCEL 5M (ENDOMECHANICALS) ×6 IMPLANT
SOLUTION ELECTROLUBE (MISCELLANEOUS) ×3 IMPLANT
SPOGE SURGIFLO 8M (HEMOSTASIS) ×1
SPONGE LAP 4X18 RFD (DISPOSABLE) ×3 IMPLANT
SPONGE SURGIFLO 8M (HEMOSTASIS) ×2 IMPLANT
SPONGE VERSALON 4X4 4PLY (MISCELLANEOUS) IMPLANT
STAPLE ECHEON FLEX 60 POW ENDO (STAPLE) ×3 IMPLANT
STAPLER RELOAD WHITE 60MM (STAPLE) ×3
STAPLER SKIN PROX 35W (STAPLE) ×3 IMPLANT
STRAP SAFETY 5IN WIDE (MISCELLANEOUS) ×3 IMPLANT
SURGILUBE 2OZ TUBE FLIPTOP (MISCELLANEOUS) ×3 IMPLANT
SUT DVC VLOC 90 3-0 CV23 UNDY (SUTURE) ×6 IMPLANT
SUT DVC VLOC 90 3-0 CV23 VLT (SUTURE) ×3
SUT ETHILON 3-0 FS-10 30 BLK (SUTURE)
SUT MNCRL 4-0 (SUTURE) ×2
SUT MNCRL 4-0 27XMFL (SUTURE) ×4
SUT PROLENE 5 0 RB 1 DA (SUTURE) IMPLANT
SUT SILK 2 0 SH (SUTURE) ×3 IMPLANT
SUT VIC AB 0 CT1 36 (SUTURE) ×6 IMPLANT
SUT VIC AB 2-0 CT1 (SUTURE) ×9 IMPLANT
SUT VIC AB 2-0 SH 27 (SUTURE)
SUT VIC AB 2-0 SH 27XBRD (SUTURE) IMPLANT
SUT VICRYL 0 AB UR-6 (SUTURE) ×9 IMPLANT
SUTURE DVC VLC 90 3-0 CV23 VLT (SUTURE) ×2 IMPLANT
SUTURE EHLN 3-0 FS-10 30 BLK (SUTURE) IMPLANT
SUTURE MNCRL 4-0 27XMF (SUTURE) ×4 IMPLANT
SYR 10ML LL (SYRINGE) ×3 IMPLANT
SYR BULB IRRIG 60ML STRL (SYRINGE) ×3 IMPLANT
SYRINGE IRR TOOMEY STRL 70CC (SYRINGE) ×6 IMPLANT
TAPE CLOTH 3X10 WHT NS LF (GAUZE/BANDAGES/DRESSINGS) ×6 IMPLANT
TROCAR ENDOPATH XCEL 12X100 BL (ENDOMECHANICALS) ×6 IMPLANT
TROCAR XCEL 12X100 BLDLESS (ENDOMECHANICALS) ×3 IMPLANT
TROCAR XCEL NON-BLD 5MMX100MML (ENDOMECHANICALS) ×3 IMPLANT

## 2019-06-12 NOTE — Transfer of Care (Signed)
Immediate Anesthesia Transfer of Care Note  Patient: Justin Tyler  Procedure(s) Performed: XI ROBOTIC ASSISTED LAPAROSCOPIC RADICAL PROSTATECTOMY (N/A ) PELVIC LYMPH NODE DISSECTION (Bilateral )  Patient Location: PACU  Anesthesia Type:General  Level of Consciousness: awake, alert , oriented and patient cooperative  Airway & Oxygen Therapy: Patient Spontanous Breathing and Patient connected to face mask oxygen  Post-op Assessment: Report given to RN and Post -op Vital signs reviewed and stable  Post vital signs: Reviewed and stable  Last Vitals:  Vitals Value Taken Time  BP 127/72 06/12/19 1200  Temp 36.9 C 06/12/19 1200  Pulse 77 06/12/19 1204  Resp 10 06/12/19 1204  SpO2 100 % 06/12/19 1204  Vitals shown include unvalidated device data.  Last Pain:  Vitals:   06/12/19 1200  TempSrc:   PainSc: 4       Patients Stated Pain Goal: 0 ([id number])  Complications: No apparent anesthesia complications

## 2019-06-12 NOTE — Op Note (Signed)
06/12/19  PREOPERATIVE DIAGNOSIS: Prostate cancer.  POSTOPERATIVE DIAGNOSIS: Prostate cancer.  OPERATION PERFORMED: 1. DaVinci laparoscopic radical prostatectomy (bilateral nerve sparing) 2 DaVinci laproscopic bilateral pelvic lymph node dissection.  SURGEON: Hollice Espy, MD  ASSISTANTS: Nickolas Madrid, MD  ANESTHESIA: General.  EBL: 100 cc  SPECIMEN: Prostate with bilateral seminal vesicals, bilateral pelvic lymph nodes, anterior fat pad.  FINDINGS: Accessory Pudendal Vessel: none  INDICATION: Pt.is a45 year old male with Gleason 3+4 prostate cancer. Treatment options were discussed with him at length and he chose DaVinci radical prostatectomy.  Moderate baseline erections therefore plan nerve sparing was discussed. Bilateral pelvic lymph node dissection was planned due to his risk stratification.  PROCEDURE IN DETAIL: Patient was given Ancef preoperatively. He had sequential compression devices applied preoperatively for DVT prophylaxis. He was taken to the operating room where he was induced with general anesthesia. After adequate anesthesia, he was placed in the dorsal lithotomy position. His arms were draped by his side and was appropriately padded and secured to the operating room table. He was placed in the Trendelenburg position.  He was prepped and draped in sterile fashion. An 61 French Foley was placed in the bladder and instilled with 15 cc sterile water. Orogastric tube was placed. The Veress needle was passed just above the umbilicus and the abdomen was insufflated to 15 atmospheres. A 12 mm, blunt-tip trocar was placed just above the umbilicus. The zero-degree camera was passed within this and the following trocars were placed under direct vision; 8 mm robotic trocars were placed 9 cm laterally and inferiorly to the initially placed umbilical trocar. A third one was placed 7 cm lateral to the left-sided trocar. In the corresponding position on  the right side, a 12 mm trocar was placed, and then a 5 mm trocar was placed to the right and well above the umbilicus.  The robot was then docked with the robot trocar. I used the zero-degree camera. I had the hot scissors in the right hand and the left hand with the [place] bipolar and far left hand the Prograsp forceps. Initially I divided the median umbilical ligament bilaterally and the urachus and developed the space of Retzius down to pubic bone. I divided the parietal peritoneum laterally up to the vas deferens on each side. I used the forceps to provide cranial traction on the urachus. I cleaned off the Endopelvic fascia on each side and then divided it with the scissors laterally to the perirectal fat and medially to the puboprostatic ligaments which were divided. I then ligated the dorsal vein complex using a 60 mm vascular load stapler.   I then addressed the bladder neck with a 30-degree down lens. I identified the bladder neck by pulling on the Foley catheter. I divided the anterior bladder neck musculature until I then found the anterior bladder neck mucosa which was incised. I identified the Foley catheter within, deflated the balloon, pulled the Foley out through this opening and then using the Carter-Thomason needle with a #0-Vicryl suture, passed through The suprapubic region and pulled the suture through the eye of the Foley and then back out. This allowed me to provide upward traction on the prostate. I then divided the lateral bladder neck mucosa and the posterior bladder neck mucosa.  Notably, initially a small median lobe was not recognized but upon opening the bladder neck further, small well-circumscribed median lobe was appreciated.  This was retracted superiorly.  The posterior incision was then created just inferior to this median lobe at the junction  of the median lobe and the prostate. I was well away from ureteral orifices. I divided the posterior  bladder neck musculature until I identified the vas deferens. They were freed proximally, then divided. I freed up the seminal vesicals using blunt and sharp dissection. Extremely judicious use of electrocautery was used near the seminal vesicle tips to avoid injury to neurovascular bundle.   I then went back to the 0-degree lens. I divided the Denonvilliers fascia beneath the prostate and developed the prostate off the rectum. I then did a bilateral nerve sparing by  creating a plane with was close to the capsule but not violating. I then isolated the pedicles of the prostate and placed weck clips on the pedicles of the prostate and then divided it with cold scissors. I continued to divide the neurovascular bundles off the prostate out to the apex of the prostate. At this point the prostate was freed up except for the urethra. I addressed the prostate anteriorly, divided the dorsal vein , then the anterior urethral wall, pulled the Foley catheter back and then divided the posterior urethral wall. Specimen was completely freed up. I placed the prostate in an Endo catch bag and then placed the bag in the upper abdomen out of the way. I then irrigated the pelvis. The rectal test was negative. There was reasonable hemostasis.  I then did the pelvic lymph node dissection by incising the fascia overlying the right external iliac vein, dissecting distally. I went just distal to the node of [place] where we placed clips and then divided the lymphatics. The lateral aspect of the dissection was the pelvic side wall, inferior was the obturator nerve and proximal the hypogastric vessels. I placed clips at the proximal aspect and then divided the lymphatics. This was removed with the spoon grasper and sent to pathology.   I then did the left obturator lymph node dissection in the same fashion as the left side.  With good hemostasis, I then did the posterior reconstruction. I used a 3-0 VLoc  suture on an RB1 through the cut edge of Denonvilliers fascia beneath the bladder on the right side and through the posterior striated sphincter underneath the urethra. This brought the bladder neck and urethra and closer proximity to help facilitate anastomosis.   I then did the urethral vesicle anastomosis again with two 3-0 VLoc sutures on an RB1 needle interlocked. I passed both ends of the suture from the outside-in through the bladder neck at the 6 o'clock position. I passed both through the urethral stump from the inside-out in the corresponding position. I reapproximated the bladder neck to the urethra. I then ran the Left suture on the left side anastomosis to the 9 o'clock position. Then I went back to the right sided suture and ran that up the right side to the 12 o'clock position. I then continued the left suture to the 12 o'clock position.The suture was then suspended anteriorly behind the pubic bone.   I then placed a new 8 French Foley into the bladder and filled it with 10 cc sterile water. I irrigated the bladder with 160 cc. There was no leakage. There was reasonable hemostasis.  Surgicel was used within the lymph node dissection beds and Surgi-Flo was used around anastomosis for an additional hemostasis.  The instruments were then removed. The robot was undocked and all the trocars were removed under direct vision. There was good hemostasis. I then enlarged the umbilical trocar site large enough to remove the prostate  and I closed the fascia here with #0-0 Vicryl suture in a running fashion. All the port sites were irrigated. Lidocaine was injected into all the trocar sites. The skin was closed with 4-0 Monocryl in running subcuticular fashion. Dermabond was applied.   At this point patient was awakened and extubated in the operating room and taken to the recovery room in stable condition. There were no complications. All counts correct.  Hollice Espy, MD

## 2019-06-12 NOTE — Interval H&P Note (Signed)
History and Physical Interval Note:  06/12/2019 7:19 AM  Justin Tyler  has presented today for surgery, with the diagnosis of prostate cancer.  The various methods of treatment have been discussed with the patient and family. After consideration of risks, benefits and other options for treatment, the patient has consented to  Procedure(s): XI ROBOTIC ASSISTED LAPAROSCOPIC RADICAL PROSTATECTOMY (N/A) PELVIC LYMPH NODE DISSECTION (Bilateral) as a surgical intervention.  The patient's history has been reviewed, patient examined, no change in status, stable for surgery.  I have reviewed the patient's chart and labs.  Questions were answered to the patient's satisfaction.    RRR CTAB   Hollice Espy

## 2019-06-12 NOTE — Anesthesia Preprocedure Evaluation (Signed)
Anesthesia Evaluation  Patient identified by MRN, date of birth, ID band Patient awake    Reviewed: Allergy & Precautions, H&P , NPO status , Patient's Chart, lab work & pertinent test results  History of Anesthesia Complications Negative for: history of anesthetic complications  Airway Mallampati: I  TM Distance: >3 FB Neck ROM: full    Dental  (+) Chipped   Pulmonary neg shortness of breath, former smoker,           Cardiovascular Exercise Tolerance: Good hypertension, (-) angina(-) Past MI and (-) DOE      Neuro/Psych  Headaches, negative psych ROS   GI/Hepatic negative GI ROS, Neg liver ROS, neg GERD  ,  Endo/Other  negative endocrine ROS  Renal/GU      Musculoskeletal  (+) Arthritis , Fibromyalgia -  Abdominal   Peds  Hematology negative hematology ROS (+)   Anesthesia Other Findings Past Medical History: No date: Anxiety No date: Arthritis No date: Cancer ([place]) No date: Headache     Comment:  h/o migraines No date: Heart murmur     Comment:  asymptomatic No date: Hypertension  Past Surgical History: No date: JOINT REPLACEMENT No date: TOTAL HIP ARTHROPLASTY; Right 11/18/2017: TOTAL HIP ARTHROPLASTY; Left     Comment:  Procedure: TOTAL HIP ARTHROPLASTY;  Surgeon: Corky Mull, MD;  Location: [facility] ORS;  Service: Orthopedics;                Laterality: Left; No date: TOTAL HIP REVISION; Right     Reproductive/Obstetrics negative OB ROS                             Anesthesia Physical Anesthesia Plan  ASA: III  Anesthesia Plan: General ETT   Post-op Pain Management:    Induction: Intravenous  PONV Risk Score and Plan: Ondansetron, Dexamethasone, Midazolam and Treatment may vary due to age or medical condition  Airway Management Planned: Oral ETT  Additional Equipment:   Intra-op Plan:   Post-operative Plan: Extubation in OR  Informed Consent: I  have reviewed the patients History and Physical, chart, labs and discussed the procedure including the risks, benefits and alternatives for the proposed anesthesia with the patient or authorized representative who has indicated his/her understanding and acceptance.     Dental Advisory Given  Plan Discussed with: Anesthesiologist, CRNA and Surgeon  Anesthesia Plan Comments: (Patient consented for risks of anesthesia including but not limited to:  - adverse reactions to medications - damage to teeth, lips or other oral mucosa - sore throat or hoarseness - Damage to heart, brain, lungs or loss of life  Patient voiced understanding.)        Anesthesia Quick Evaluation

## 2019-06-12 NOTE — Anesthesia Post-op Follow-up Note (Signed)
Anesthesia QCDR form completed.        

## 2019-06-12 NOTE — Anesthesia Postprocedure Evaluation (Signed)
Anesthesia Post Note  Patient: Justin Tyler  Procedure(s) Performed: XI ROBOTIC ASSISTED LAPAROSCOPIC RADICAL PROSTATECTOMY (N/A ) PELVIC LYMPH NODE DISSECTION (Bilateral )  Patient location during evaluation: PACU Anesthesia Type: General Level of consciousness: awake and alert Pain management: pain level controlled Vital Signs Assessment: post-procedure vital signs reviewed and stable Respiratory status: spontaneous breathing, nonlabored ventilation, respiratory function stable and patient connected to nasal cannula oxygen Cardiovascular status: blood pressure returned to baseline and stable Postop Assessment: no apparent nausea or vomiting Anesthetic complications: no     Last Vitals:  Vitals:   06/12/19 1300 06/12/19 1315  BP: 123/77 132/80  Pulse: 72 78  Resp: 11 12  Temp:  36.5 C  SpO2: 97% 97%    Last Pain:  Vitals:   06/12/19 1315  TempSrc:   PainSc: 3                  Precious Haws Piscitello

## 2019-06-12 NOTE — Anesthesia Procedure Notes (Signed)
Procedure Name: Intubation Date/Time: 06/12/2019 8:48 AM Performed by: Eben Burow, CRNA Pre-anesthesia Checklist: Patient identified, Emergency Drugs available, Suction available and Patient being monitored Patient Re-evaluated:Patient Re-evaluated prior to induction Oxygen Delivery Method: Circle system utilized Preoxygenation: Pre-oxygenation with 100% oxygen Induction Type: IV induction Ventilation: Mask ventilation without difficulty and Oral airway inserted - appropriate to patient size Laryngoscope Size: Miller and 3 Grade View: Grade I Tube type: Oral Tube size: 8.0 mm Number of attempts: 1 Airway Equipment and Method: Stylet,  Oral airway and LTA kit utilized Placement Confirmation: ETT inserted through vocal cords under direct vision,  positive ETCO2 and breath sounds checked- equal and bilateral Secured at: 23 cm Tube secured with: Tape Dental Injury: Teeth and Oropharynx as per pre-operative assessment

## 2019-06-13 ENCOUNTER — Encounter: Payer: Self-pay | Admitting: Urology

## 2019-06-13 DIAGNOSIS — C61 Malignant neoplasm of prostate: Secondary | ICD-10-CM

## 2019-06-13 LAB — BASIC METABOLIC PANEL
Anion gap: 9 (ref 5–15)
BUN: 16 mg/dL (ref 8–23)
CO2: 23 mmol/L (ref 22–32)
Calcium: 8.2 mg/dL — ABNORMAL LOW (ref 8.9–10.3)
Chloride: 105 mmol/L (ref 98–111)
Creatinine, Ser: 0.62 mg/dL (ref 0.61–1.24)
GFR calc Af Amer: >60 mL/min (ref >60)
GFR calc non Af Amer: >60 mL/min (ref >60)
Glucose, Bld: 128 mg/dL — ABNORMAL HIGH (ref 70–99)
Potassium: 3.4 mmol/L — ABNORMAL LOW (ref 3.5–5.1)
Sodium: 137 mmol/L (ref 135–145)

## 2019-06-13 LAB — CBC
HCT: 36 % — ABNORMAL LOW (ref 39.0–52.0)
Hemoglobin: 12.2 g/dL — ABNORMAL LOW (ref 13.0–17.0)
MCH: 30.6 pg (ref 26.0–34.0)
MCHC: 33.9 g/dL (ref 30.0–36.0)
MCV: 90.2 fL (ref 80.0–100.0)
Platelets: 201 10*3/uL (ref 150–400)
RBC: 3.99 MIL/uL — ABNORMAL LOW (ref 4.22–5.81)
RDW: 12.8 % (ref 11.5–15.5)
WBC: 14.6 10*3/uL — ABNORMAL HIGH (ref 4.0–10.5)
nRBC: 0 % (ref 0.0–0.2)

## 2019-06-13 MED ORDER — CHLORHEXIDINE GLUCONATE CLOTH 2 % EX PADS
6.0000 | MEDICATED_PAD | Freq: Every day | CUTANEOUS | Status: DC
  Administered 2019-06-13: 6 via TOPICAL

## 2019-06-13 MED ORDER — OXYCODONE-ACETAMINOPHEN 5-325 MG PO TABS: 1.0000 | ORAL_TABLET | ORAL | 0 refills | Status: DC | PRN

## 2019-06-13 MED ORDER — OXYBUTYNIN CHLORIDE 5 MG PO TABS: 5.0000 mg | ORAL_TABLET | Freq: Three times a day (TID) | ORAL | 0 refills | Status: AC | PRN

## 2019-06-13 MED ORDER — DOCUSATE SODIUM 100 MG PO CAPS: 100.0000 mg | ORAL_CAPSULE | Freq: Two times a day (BID) | ORAL | 0 refills | Status: DC

## 2019-06-13 NOTE — Discharge Summary (Signed)
Date of admission: 06/12/2019  Date of discharge: 06/13/2019  Admission diagnosis: Prostate cancer  Discharge diagnosis: Same as above  Secondary diagnoses:  Patient Active Problem List   Diagnosis Date Noted  . Prostate cancer ([place]) 06/12/2019  . 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 Briefly, Justin Tyler is a 63 y.o. year old patient admitted on 06/12/2019 for a scheduled robotic laparoscopic radical prostatectomy, bilateral nerve sparing and bilateral pelvic lymph node dissection in the setting of Gleason 3+4 prostate cancer.   Hospital Course: Patient tolerated the procedure well.  He was then transferred to the floor after an uneventful PACU stay.  His hospital course was uncomplicated.  On POD#1 he had met discharge criteria: was eating a regular diet, was up and ambulating independently,  pain was well controlled, and was ready for discharge.  Physical Exam  Constitutional: He is oriented to person, place, and time. He appears well-developed. No distress.  HENT:  Head: Normocephalic and atraumatic.  Pulmonary/Chest: Effort normal. No respiratory distress.  Abdominal: Soft. He exhibits no distension and no mass. There is no abdominal tenderness. There is no rebound and no guarding.  Multiple surgical incisions visualized on the anterior abdomen.  Incisions covered with surgical adhesive.  Incisions clean and dry without overlying erythema.  Neurological: He is alert and oriented to person, place, and time.  Skin: Skin is warm and dry. He is not diaphoretic.  Psychiatric: He has a normal mood and affect. His behavior is normal.  Nursing note and vitals reviewed.  Laboratory values:  Recent Labs    06/13/19 0535  WBC 14.6*  HGB 12.2*  HCT 36.0*   Recent Labs    06/13/19 0535  NA 137  K 3.4*  CL 105  CO2 23  GLUCOSE 128*  BUN 16  CREATININE 0.62  CALCIUM 8.2*    Disposition: Home  Discharge instruction: The patient was instructed to be ambulatory but told to refrain from heavy lifting, strenuous activity, or driving.  He was provided Foley care with plans for him to return to the clinic in 1 week for catheter removal with voiding trial.  Discharge medications:  Allergies as of 06/13/2019   No Known Allergies     Medication List    TAKE these medications   amLODipine 5 MG tablet Commonly known as: NORVASC Take 5 mg by mouth every evening.   atorvastatin 10 MG tablet Commonly known as: LIPITOR Take 10 mg by mouth daily at 6 PM.   carvedilol 3.125 MG tablet Commonly known as: COREG Take 3.125 mg by mouth 2 (two) times daily with a meal.   docusate sodium 100 MG capsule Commonly known as: COLACE Take 1 capsule (100 mg total) by mouth 2 (two) times daily.   losartan 50 MG tablet Commonly known as: COZAAR Take 50 mg by mouth 2 (two) times daily.   meloxicam 15 MG tablet Commonly known as: MOBIC Take 15 mg by mouth daily.   oxybutynin 5 MG tablet Commonly known as: DITROPAN Take 1 tablet (5 mg total) by mouth every 8 (eight) hours as needed for up to 10 days for bladder spasms.   oxyCODONE-acetaminophen 5-325 MG tablet Commonly known as: PERCOCET/ROXICET Take 1-2 tablets by mouth every 4 (four) hours as needed for moderate pain.   SUMAtriptan 100 MG tablet Commonly known as: IMITREX Take 100 mg by mouth every 2 (two) hours as needed for migraine. May repeat  in 2 hours if headache persists or recurs.   Venlafaxine HCl 225 MG Tb24 Take 225 mg by mouth daily with breakfast.       Followup:  1 week Foley removal and voiding trial, 06/19/2019 PSA lab draw, 07/07/2019 1 month postop follow-up with Dr. Erlene Quan, 07/11/2019

## 2019-06-13 NOTE — Care Management Obs Status (Signed)
[place] NOTIFICATION   Patient Details  Name: Justin Tyler MRN: [id number] Date of Birth: 1956-08-21   Medicare Observation Status Notification Given:  No(observation less than 24 hours)    Beverly Sessions, RN 06/13/2019, 2:31 PM

## 2019-06-13 NOTE — Progress Notes (Signed)
Justin Tyler  A and O x 4 VSS. Pt tolerating diet well. No complaints of pain or nausea. IV removed intact, prescriptions given. Pt voices understanding of discharge instructions with no further questions. Pt discharged via wheelchair with axillary.   Allergies as of 06/13/2019   No Known Allergies     Medication List    TAKE these medications   amLODipine 5 MG tablet Commonly known as: NORVASC Take 5 mg by mouth every evening.   atorvastatin 10 MG tablet Commonly known as: LIPITOR Take 10 mg by mouth daily at 6 PM.   carvedilol 3.125 MG tablet Commonly known as: COREG Take 3.125 mg by mouth 2 (two) times daily with a meal.   docusate sodium 100 MG capsule Commonly known as: COLACE Take 1 capsule (100 mg total) by mouth 2 (two) times daily.   losartan 50 MG tablet Commonly known as: COZAAR Take 50 mg by mouth 2 (two) times daily.   meloxicam 15 MG tablet Commonly known as: MOBIC Take 15 mg by mouth daily.   oxybutynin 5 MG tablet Commonly known as: DITROPAN Take 1 tablet (5 mg total) by mouth every 8 (eight) hours as needed for up to 10 days for bladder spasms.   oxyCODONE-acetaminophen 5-325 MG tablet Commonly known as: PERCOCET/ROXICET Take 1-2 tablets by mouth every 4 (four) hours as needed for moderate pain.   SUMAtriptan 100 MG tablet Commonly known as: IMITREX Take 100 mg by mouth every 2 (two) hours as needed for migraine. May repeat in 2 hours if headache persists or recurs.   Venlafaxine HCl 225 MG Tb24 Take 225 mg by mouth daily with breakfast.       Vitals:   06/13/19 0519 06/13/19 1349  BP: (!) 144/83 124/82  Pulse: 77 86  Resp: 16 20  Temp: 99.2 F (37.3 C) 99.9 F (37.7 C)  SpO2: 98% 95%    Justin Tyler

## 2019-06-13 NOTE — Discharge Instructions (Signed)
Activity:  You are encouraged to ambulate frequently (about every hour during waking hours) to help prevent blood clots from forming in your legs or lungs.  However, you should not engage in any heavy lifting (> 5-10 lbs), strenuous activity, or straining.   Diet: You should advance your diet as instructed by your physician.  It will be normal to have some bloating, nausea, and abdominal discomfort intermittently.   Prescriptions:  You will be provided a prescription for pain medication to take as needed.  If your pain is not severe enough to require the prescription pain medication, you may take extra strength Tylenol instead which will have less side effects.  You should also take a prescribed stool softener to avoid straining with bowel movements as the prescription pain medication may constipate you.   Incisions: You may remove your dressing bandages 48 hours after surgery if not removed in the hospital.  You will either have some small staples or special tissue glue at each of the incision sites. Once the bandages are removed (if present), the incisions may stay open to air.  You may start showering (but not soaking or bathing in water) the 2nd day after surgery and the incisions simply need to be patted dry after the shower.  No additional care is needed.  What to call us about: You should call the office if you develop fever > 101 or develop persistent vomiting, redness or draining around your incision, or any other concerning symptoms.    [facility] [address], [place], [place] [address] [phone]  Foley Catheter Care A soft, flexible tube (Foley catheter) may have been placed in your bladder to drain urine and fluid. Follow these instructions: Taking Care of the Catheter  Keep the area where the catheter leaves your body clean.   Attach the catheter to the leg so there is no tension on the catheter.   Keep the drainage bag below the  level of the bladder, but keep it OFF the floor.   Do not take long soaking baths. Your caregiver will give instructions about showering.   Wash your hands before touching ANYTHING related to the catheter or bag.   Using mild soap and warm water on a washcloth:   Clean the area closest to the catheter insertion site using a circular motion around the catheter.   Clean the catheter itself by wiping AWAY from the insertion site for several inches down the tube.   NEVER wipe upward as this could sweep bacteria up into the urethra (tube in your body that normally drains the bladder) and cause infection.   Place a small amount of sterile lubricant at the tip of the penis where the catheter is entering.  Taking Care of the Drainage Bags  Two drainage bags may be taken home: a large overnight drainage bag, and a smaller leg bag which fits underneath clothing.   It is okay to wear the overnight bag at any time, but NEVER wear the smaller leg bag at night.   Keep the drainage bag well below the level of your bladder. This prevents backflow of urine into the bladder and allows the urine to drain freely.   Anchor the tubing to your leg to prevent pulling or tension on the catheter. Use tape or a leg strap provided by the hospital.   Empty the drainage bag when it is 1/2 to 3/4 full. Wash your hands before and after touching the bag.  Periodically check the tubing for kinks to make sure there is no pressure on the tubing which could restrict the flow of urine.  Changing the Drainage Bags  Cleanse both ends of the clean bag with alcohol before changing.   Pinch off the rubber catheter to avoid urine spillage during the disconnection.   Disconnect the dirty bag and connect the clean one.   Empty the dirty bag carefully to avoid a urine spill.   Attach the new bag to the leg with tape or a leg strap.  Cleaning the Drainage Bags  Whenever a drainage bag is disconnected, it must be cleaned  quickly so it is ready for the next use.   Wash the bag in warm, soapy water.   Rinse the bag thoroughly with warm water.   Soak the bag for 30 minutes in a solution of white vinegar and water (1 cup vinegar to 1 quart warm water).   Rinse with warm water.  SEEK MEDICAL CARE IF:   You have chills or night sweats.   You are leaking around your catheter or have problems with your catheter. It is not uncommon to have sporadic leakage around your catheter as a result of bladder spasms. If the leakage stops, there is not much need for concern. If you are uncertain, call your caregiver.   You develop side effects that you think are coming from your medicines.  SEEK IMMEDIATE MEDICAL CARE IF:   You are suddenly unable to urinate. Check to see if there are any kinks in the drainage tubing that may cause this. If you cannot find any kinks, call your caregiver immediately. This is an emergency.   You develop shortness of breath or chest pains.   Bleeding persists or clots develop in your urine.   You have a fever.   You develop pain in your back or over your lower belly (abdomen).   You develop pain or swelling in your legs.   Any problems you are having get worse rather than better.  MAKE SURE YOU:   Understand these instructions.   Will watch your condition.   Will get help right away if you are not doing well or get worse.

## 2019-06-14 ENCOUNTER — Telehealth: Payer: Self-pay | Admitting: Urology

## 2019-06-14 NOTE — Telephone Encounter (Signed)
Attempted to reach, left a detailed vmail notifying Dr. Cherrie Gauze instructions and stated that she could call back with any questions or concerns

## 2019-06-14 NOTE — Telephone Encounter (Signed)
At this point, he may be better off with just Tylenol alternating with ibuprofen, 800 mg at a time 3 times a day.  This his pain level high enough at this point that he really needs narcotics?  Hollice Espy, MD

## 2019-06-14 NOTE — Telephone Encounter (Signed)
Pt's wife called to let us know Percocet is making him have headaches and wants to know if any other pain meds could be prescribed to [facility] in [place].

## 2019-06-15 NOTE — Telephone Encounter (Signed)
Spoke with patient and confirmed that message was received yesterday. He also called after hours nurse line last night with concern that some blood and urine come our around the catheter while having a bowl movement. It was explained that this can happen due to bladder spasms and to utilize his oxybutinin to help with this discomfort and blood is normal. If he has any questions or concerns he will call back, otherwise he is doing well.

## 2019-06-16 ENCOUNTER — Telehealth: Payer: Self-pay | Admitting: Urology

## 2019-06-16 LAB — SURGICAL PATHOLOGY

## 2019-06-16 NOTE — Telephone Encounter (Signed)
Pt's wife called and wants to know if the pathology report has come back from surgery yet.

## 2019-06-16 NOTE — Telephone Encounter (Signed)
Spoke with patient-pathology still in process-patient verbalized

## 2019-06-19 ENCOUNTER — Ambulatory Visit (INDEPENDENT_AMBULATORY_CARE_PROVIDER_SITE_OTHER): Payer: Medicare PPO | Admitting: Physician Assistant

## 2019-06-19 ENCOUNTER — Ambulatory Visit: Payer: 59 | Admitting: Physician Assistant

## 2019-06-19 ENCOUNTER — Other Ambulatory Visit: Payer: Self-pay

## 2019-06-19 ENCOUNTER — Encounter: Payer: Self-pay | Admitting: Physician Assistant

## 2019-06-19 VITALS — BP 128/86 | HR 77 | Ht 76.0 in | Wt 235.0 lb

## 2019-06-19 DIAGNOSIS — C61 Malignant neoplasm of prostate: Secondary | ICD-10-CM

## 2019-06-19 DIAGNOSIS — R351 Nocturia: Secondary | ICD-10-CM

## 2019-06-19 NOTE — Progress Notes (Signed)
06/19/2019 9:26 AM   Justin Tyler Mar 03, 1956 [id number]  CC: S/p radical prostatectomy  HPI: [name] is a 63 y.o. male who presents today for catheter removal. He is an established [facility] patient who underwent robotic radical prostatectomy, bilateral nerve sparing, with bilateral pelvic lymph node dissection with Dr. Erlene Quan on 06/12/2019. Pathology report returned with pT3a pN0 prostate cancer.  Patient reports gradual resolution of gross hematuria in the interim. No other postop concerns. He does report being surprised by the amount of urinary output he produced during sleep. He states he does snore at night, no prior sleep studies.  PMH: Past Medical History:  Diagnosis Date  . Anxiety   . Arthritis   . Cancer ([place])   . Headache    h/o migraines  . Heart murmur    asymptomatic  . Hypertension     Surgical History: Past Surgical History:  Procedure Laterality Date  . JOINT REPLACEMENT    . PELVIC LYMPH NODE DISSECTION Bilateral 06/12/2019   Procedure: PELVIC LYMPH NODE DISSECTION;  Surgeon: Hollice Espy, MD;  Location: [facility] ORS;  Service: Urology;  Laterality: Bilateral;  . ROBOT ASSISTED LAPAROSCOPIC RADICAL PROSTATECTOMY N/A 06/12/2019   Procedure: XI ROBOTIC ASSISTED LAPAROSCOPIC RADICAL PROSTATECTOMY;  Surgeon: Hollice Espy, MD;  Location: [facility];  Service: Urology;  Laterality: N/A;  . TOTAL HIP ARTHROPLASTY Right   . TOTAL HIP ARTHROPLASTY Left 11/18/2017   Procedure: TOTAL HIP ARTHROPLASTY;  Surgeon: Corky Mull, MD;  Location: [facility] ORS;  Service: Orthopedics;  Laterality: Left;  . TOTAL HIP REVISION Right     Home Medications:  Allergies as of 06/19/2019   No Known Allergies     Medication List       Accurate as of June 19, 2019  9:26 AM. If you have any questions, ask your nurse or doctor.        amLODipine 5 MG tablet Commonly known as: NORVASC Take 5 mg by mouth every evening.   atorvastatin 10 MG tablet Commonly known as: LIPITOR  Take 10 mg by mouth daily at 6 PM.   carvedilol 3.125 MG tablet Commonly known as: COREG Take 3.125 mg by mouth 2 (two) times daily with a meal.   docusate sodium 100 MG capsule Commonly known as: COLACE Take 1 capsule (100 mg total) by mouth 2 (two) times daily.   losartan 50 MG tablet Commonly known as: COZAAR Take 50 mg by mouth 2 (two) times daily.   meloxicam 15 MG tablet Commonly known as: MOBIC Take 15 mg by mouth daily.   oxybutynin 5 MG tablet Commonly known as: DITROPAN Take 1 tablet (5 mg total) by mouth every 8 (eight) hours as needed for up to 10 days for bladder spasms.   oxyCODONE-acetaminophen 5-325 MG tablet Commonly known as: PERCOCET/ROXICET Take 1-2 tablets by mouth every 4 (four) hours as needed for moderate pain.   SUMAtriptan 100 MG tablet Commonly known as: IMITREX Take 100 mg by mouth every 2 (two) hours as needed for migraine. May repeat in 2 hours if headache persists or recurs.   Venlafaxine HCl 225 MG Tb24 Take 225 mg by mouth daily with breakfast.       Allergies:  No Known Allergies  Family History: No family history on file.  Social History:   reports that he quit smoking about 18 years ago. His smoking use included cigarettes. He has a 20.00 pack-year smoking history. He has never used smokeless tobacco. He reports current alcohol use. He reports that  he does not use drugs.  ROS: UROLOGY Frequent Urination?: No Hard to postpone urination?: No Burning/pain with urination?: No Get up at night to urinate?: No Leakage of urine?: No Urine stream starts and stops?: No Trouble starting stream?: No Do you have to strain to urinate?: No Blood in urine?: No Urinary tract infection?: No Sexually transmitted disease?: No Injury to kidneys or bladder?: No Painful intercourse?: No Weak stream?: No Erection problems?: No Penile pain?: No  Gastrointestinal Nausea?: No Vomiting?: No Indigestion/heartburn?: No Diarrhea?: No  Constipation?: No  Constitutional Fever: No Night sweats?: No Weight loss?: No Fatigue?: No  Skin Skin rash/lesions?: No Itching?: No  Eyes Blurred vision?: No Double vision?: No  Ears/Nose/Throat Sore throat?: No Sinus problems?: No  Hematologic/Lymphatic Swollen glands?: No Easy bruising?: No  Cardiovascular Leg swelling?: No Chest pain?: No  Respiratory Cough?: No Shortness of breath?: No  Endocrine Excessive thirst?: No  Musculoskeletal Back pain?: No Joint pain?: No  Neurological Headaches?: No Dizziness?: No  Psychologic Depression?: No Anxiety?: No  Physical Exam: BP 128/86   Pulse 77   Ht 6\' 4"  (1.93 m)   Wt 235 lb (106.6 kg)   BMI 28.61 kg/m   Constitutional:  Alert and oriented, no acute distress, nontoxic appearing HEENT: [place], AT Cardiovascular: No clubbing, cyanosis, or edema Respiratory: Normal respiratory effort, no increased work of breathing GI: Scattered surgical incisions visible on the abdomen with residual overlying surgical adhesive. No areas of erythema; incisions are clean and dry. GU: Circumcised phallus, Foley catheter in place draining clear amber urine. Lymph: No cervical or inguinal lymphadenopathy Skin: No rashes, bruises or suspicious lesions Neurologic: Grossly intact, no focal deficits, moving all 4 extremities Psychiatric: Normal mood and affect  Laboratory Data: Results for orders placed or performed during the hospital encounter of 06/12/19  Surgical pathology  Result Value Ref Range   SURGICAL PATHOLOGY      SURGICAL PATHOLOGY CASE: [id number] PATIENT: Justin Tyler Surgical Pathology Report     Specimen Submitted: A. Periprostatic fat B. Pelvic lymph node, right C. Pelvic lymph node, left D. Prostate  Clinical History: Prostate cancer    DIAGNOSIS: A.  PERIPROSTATIC FAT; EXCISION: - BENIGN FIBROADIPOSE TISSUE. - NEGATIVE FOR MALIGNANCY.  B.  LYMPH NODE, RIGHT PELVIC; EXCISION: - TWO  LYMPH NODES, NEGATIVE FOR MALIGNANCY (0/2).  C.  LYMPH NODE, LEFT PELVIC; EXCISION: - THREE LYMPH NODES, NEGATIVE FOR MALIGNANCY (0/3).  D.  PROSTATE GLAND; RADICAL PROSTATECTOMY: - PROSTATIC ADENOCARCINOMA, ACINAR TYPE, GLEASON SCORE 4 + 3 = 7 (PROGNOSTIC GRADE GROUP 3). - CHANGES COMPATIBLE WITH PRIOR BIOPSY. - SEE CANCER SUMMARY.  CANCER CASE SUMMARY: PROSTATE GLAND Procedure: Radical prostatectomy Histologic Type: Acinar adenocarcinoma Histologic Grade: Grade Group and Gleason Score: Grade group 3 (Gleason score 4+3 = 7)                      Percentage of Pattern  4 in Gleason Score 7 Cancer: 70%                      Tertiary Pattern 5 (<5%) in Overall Gleason Score 7: Not applicable Extraprostatic Extension (EPE): Present, focal Urinary Bladder Neck Invasion: Not identified Seminal Vesicle Invasion: Not identified Lymphovascular Invasion: Not identified Perineural Invasion: Present Margins: Uninvolved by invasive carcinoma Treatment Effect: No known presurgical therapy Regional Lymph Nodes: Number of Lymph Nodes Involved: 0 Number of Lymph Nodes Examined: 5  Pathologic Stage Classification (pTNM, AJCC 8th Edition): pT3a pN0 TNM Descriptors:  GROSS DESCRIPTION: A. Labeled: Periprostatic fat Received: In formalin Tissue fragment(s): Multiple Size: Aggregate, 2.5 x 2.0 x 0.3 cm Description: Unoriented fibrofatty tissue fragments Entirely submitted in 1 cassette.  B. Labeled: Right pelvic lymph node Received: In formalin Tissue fragment(s): 2 Size: 1.0 and 1.5 cm in greatest dimension Description: 2 fatty soft lymph nod es Entirely submitted in 2 cassettes as follow: 1-bisected lymph node 2-3-one bisected lymph node.  C. Labeled: Left pelvic lymph node Received: In formalin Tissue fragment(s): 3 Size: From 0.8-1.3 cm in greatest dimension Description: 3 fatty soft lymph nodes Entirely and individually submitted in 3 cassettes (Each lymph node is bisected).    D. Labeled: Prostate Received: In formalin Type of procedure: Radical prostatectomy Weight of specimen: 67 grams Size of specimen: 6.0 x 4.5 x 4.5 cm Orientation / inking: Right anterior quadrant-in black Left anterior quadrant-in blue Right posterior quadrant-in green Left posterior quadrant-in yellow Seminal vesicles: 4.0 x 1.0 x 0.7 cm each Vasa deferens: 5.0 cm in length and 0.4 cm in diameter each Description of mass(es): There are at least 3 areas shows bilateral yellow irregular tumor which is mostly located at the left side of the prostatic gland and occupied approximately 18% of the parenchyma grossly.  Tumor  possibly adjacent to the mid lateral capsule. Location of mass(es): Bilateral mid lateral prostate Description of remainder tissue: Multinodular and multicystic changes. The nodules ranging from 0.3 to 1.0 cm in greatest dimension and shows fibrotic cut surface.  Block summary: 1-apical (distal) surgical margins, perpendicular section 2-bladder neck (functional) surgical margins, perpendicular sections 3-bilateral seminal vesicles and vas deferens surgical margins 4-17-right side of the prostatic glands from distal to proximal direction 18-31-left site of prostatic gland from distal to proximal direction    Final Diagnosis performed by Allena Napoleon, MD.   Electronically signed 06/16/2019 4:12:38PM The electronic signature indicates that the named Attending Pathologist has evaluated the specimen Technical component performed at [facility], [address], [place], [place] [address] Lab: [phone] Dir: Rush Farmer, MD, MMM  Professional component performed at [facility],  [facility], [place], [place], [place] [address] Lab: [phone] Dir: Dellia Nims. Reuel Derby, MD    Assessment & Plan:   1. Prostate cancer ([place]) Incisions healing well following radical prostatectomy with lymph node dissection.  Catheter removed today.  Patient is not  believed to be at risk for urinary retention, no need for voiding trial today.  I did counsel him on signs and symptoms of urinary retention, including abdominal distention, suprapubic pressure, and the sensation of the inability to void or incomplete bladder emptying.  I explained that if he experiences the symptoms, he should contact the office.  He expressed understanding. Catheter Removal Patient is present today for a catheter removal.  62ml of water was drained from the balloon. A 18FR foley cath was removed from the bladder no complications were noted . Patient tolerated well. Performed by: Debroah Loop, PA-C Follow up/ Additional notes: RTC for 4-week postop follow-up with Dr. Erlene Quan on 07/11/2019.  2. Nocturia Counseled patient that sleep apnea can cause increased urinary output overnight.  He reports he does snore and has never undergone a sleep study.  I explained that his primary care provider should manage any possible sleep study referrals and counseled him to raise this with his PCP.  He expressed understanding.  Debroah Loop, PA-C  [facility] [address] [place], [place] [address] [phone]

## 2019-06-20 ENCOUNTER — Encounter: Payer: Medicare PPO | Admitting: Physical Therapy

## 2019-07-06 ENCOUNTER — Other Ambulatory Visit: Payer: Self-pay

## 2019-07-06 DIAGNOSIS — C61 Malignant neoplasm of prostate: Secondary | ICD-10-CM

## 2019-07-07 ENCOUNTER — Other Ambulatory Visit: Payer: Self-pay

## 2019-07-07 ENCOUNTER — Other Ambulatory Visit: Payer: Medicare PPO

## 2019-07-07 DIAGNOSIS — C61 Malignant neoplasm of prostate: Secondary | ICD-10-CM

## 2019-07-08 LAB — PSA: Prostate Specific Ag, Serum: <0.1 ng/mL (ref 0.0–4.0)

## 2019-07-11 ENCOUNTER — Telehealth: Payer: Self-pay | Admitting: Urology

## 2019-07-11 ENCOUNTER — Ambulatory Visit (INDEPENDENT_AMBULATORY_CARE_PROVIDER_SITE_OTHER): Payer: 59 | Admitting: Urology

## 2019-07-11 ENCOUNTER — Other Ambulatory Visit: Payer: Self-pay

## 2019-07-11 ENCOUNTER — Encounter: Payer: Self-pay | Admitting: Urology

## 2019-07-11 VITALS — BP 136/89 | HR 62 | Ht 76.0 in | Wt 236.0 lb

## 2019-07-11 DIAGNOSIS — N393 Stress incontinence (female) (male): Secondary | ICD-10-CM

## 2019-07-11 DIAGNOSIS — C61 Malignant neoplasm of prostate: Secondary | ICD-10-CM

## 2019-07-11 DIAGNOSIS — N5231 Erectile dysfunction following radical prostatectomy: Secondary | ICD-10-CM

## 2019-07-11 MED ORDER — SILDENAFIL CITRATE 20 MG PO TABS: 20.0000 mg | ORAL_TABLET | ORAL | 11 refills | Status: DC | PRN

## 2019-07-11 NOTE — Progress Notes (Signed)
07/11/2019 2:00 PM   Justin Tyler 05/02/1956 [id number]  Referring provider: Wayland Denis, PA-C [address] [place],  [place] [address]  Chief Complaint  Patient presents with  . Routine Post Op    HPI: 63 year old male with intermediate risk prostate cancer status post robotic prostatectomy with bilateral pelvic lymph node dissection on 06/12/2019.  He returns today for routine follow-up.  Surgical pathology shows focal extraprostatic extension with negative margins, negative lymph nodes, negative seminal vesicles and bladder neck.  pT3 aN0 MX.  PSA today is undetectable.  Overall, he has had extremely easy postoperative course.  He is back to all routine activity.  No incisional issues.  He has very little leakage, only a small amount with extreme stress activities.  He continues to improve.  He is questions today about his sexual function.  He is interested know whether or not he can start pursuing this again.  He is not had any spontaneous erectile functions and surgery.   PMH: Past Medical History:  Diagnosis Date  . Anxiety   . Arthritis   . Cancer ([place])   . Headache    h/o migraines  . Heart murmur    asymptomatic  . Hypertension     Surgical History: Past Surgical History:  Procedure Laterality Date  . JOINT REPLACEMENT    . PELVIC LYMPH NODE DISSECTION Bilateral 06/12/2019   Procedure: PELVIC LYMPH NODE DISSECTION;  Surgeon: Hollice Espy, MD;  Location: [facility] ORS;  Service: Urology;  Laterality: Bilateral;  . ROBOT ASSISTED LAPAROSCOPIC RADICAL PROSTATECTOMY N/A 06/12/2019   Procedure: XI ROBOTIC ASSISTED LAPAROSCOPIC RADICAL PROSTATECTOMY;  Surgeon: Hollice Espy, MD;  Location: [facility] OR[facility];  Service: Urology;  Laterality: N/A;  . TOTAL HIP ARTHROPLASTY Right   . TOTAL HIP ARTHROPLASTY Left 11/18/2017   Procedure: TOTAL HIP ARTHROPLASTY;  Surgeon: Corky Mull, MD;  Location: [facility] ORS;  Service: Orthopedics;  Laterality: Left;  .  TOTAL HIP REVISION Right     Home Medications:  Allergies as of 07/11/2019   No Known Allergies     Medication List       Accurate as of July 11, 2019  2:00 PM. If you have any questions, ask your nurse or doctor.        amLODipine 5 MG tablet Commonly known as: NORVASC Take 5 mg by mouth every evening.   atorvastatin 10 MG tablet Commonly known as: LIPITOR Take 10 mg by mouth daily at 6 PM.   carvedilol 3.125 MG tablet Commonly known as: COREG Take 3.125 mg by mouth 2 (two) times daily with a meal.   docusate sodium 100 MG capsule Commonly known as: COLACE Take 1 capsule (100 mg total) by mouth 2 (two) times daily.   losartan 50 MG tablet Commonly known as: COZAAR Take 50 mg by mouth 2 (two) times daily.   meloxicam 15 MG tablet Commonly known as: MOBIC Take 15 mg by mouth daily.   oxyCODONE-acetaminophen 5-325 MG tablet Commonly known as: PERCOCET/ROXICET Take 1-2 tablets by mouth every 4 (four) hours as needed for moderate pain.   sildenafil 20 MG tablet Commonly known as: Revatio Take 1 tablet (20 mg total) by mouth as needed. Take 1-5 tabs as needed prior to intercourse Started by: Hollice Espy, MD   SUMAtriptan 100 MG tablet Commonly known as: IMITREX Take 100 mg by mouth every 2 (two) hours as needed for migraine. May repeat in 2 hours if headache persists or recurs.   Venlafaxine HCl 225 MG  Tb24 Take 225 mg by mouth daily with breakfast.       Allergies: No Known Allergies  Family History: No family history on file.  Social History:  reports that he quit smoking about 18 years ago. His smoking use included cigarettes. He has a 20.00 pack-year smoking history. He has never used smokeless tobacco. He reports current alcohol use. He reports that he does not use drugs.  ROS: UROLOGY Frequent Urination?: No Hard to postpone urination?: No Burning/pain with urination?: No Get up at night to urinate?: Yes Leakage of urine?: Yes Urine  stream starts and stops?: No Trouble starting stream?: No Do you have to strain to urinate?: No Blood in urine?: No Urinary tract infection?: No Sexually transmitted disease?: No Injury to kidneys or bladder?: No Painful intercourse?: No Weak stream?: No Erection problems?: Yes Penile pain?: No  Gastrointestinal Nausea?: No Vomiting?: No Indigestion/heartburn?: No Diarrhea?: No Constipation?: No  Constitutional Fever: No Night sweats?: No Weight loss?: No Fatigue?: No  Skin Skin rash/lesions?: No Itching?: No  Eyes Blurred vision?: No Double vision?: No  Ears/Nose/Throat Sore throat?: No Sinus problems?: No  Hematologic/Lymphatic Swollen glands?: No Easy bruising?: No  Cardiovascular Leg swelling?: No Chest pain?: No  Respiratory Cough?: No Shortness of breath?: No  Endocrine Excessive thirst?: No  Musculoskeletal Back pain?: No Joint pain?: No  Neurological Headaches?: No Dizziness?: No  Psychologic Depression?: No Anxiety?: No  Physical Exam: BP 136/89   Pulse 62   Ht 6\' 4"  (1.93 m)   Wt 236 lb (107 kg)   BMI 28.73 kg/m   Constitutional:  Alert and oriented, No acute distress. HEENT: [place] AT, moist mucus membranes.  Trachea midline, no masses. Cardiovascular: No clubbing, cyanosis, or edema. Respiratory: Normal respiratory effort, no increased work of breathing. GI: Abdomen is soft, nontender, nondistended, no abdominal masses.  Wounds clean dry and intact. Skin: No rashes, bruises or suspicious lesions. Neurologic: Grossly intact, no focal deficits, moving all 4 extremities. Psychiatric: Normal mood and affect.  Laboratory Data: Lab Results  Component Value Date   WBC 14.6 (H) 06/13/2019   HGB 12.2 (L) 06/13/2019   HCT 36.0 (L) 06/13/2019   MCV 90.2 06/13/2019   PLT 201 06/13/2019    Lab Results  Component Value Date   CREATININE 0.62 06/13/2019    Assessment & Plan:    1. Prostate cancer ([place]) No evidence of disease,  PSA undetectable We will continue to follow PSA on a every 6 month basis - PSA; Future  2. Stress incontinence, male Minimal, doing quite well for early in the recovery process Continue pelvic floor exercises Prognosis is excellent  3. Erectile dysfunction after radical prostatectomy We discussed penile rehabilitation today, will start with sildenafil and titrate as needed Optimal dosing was discussed We also discussed intracavernosal injections today at length, discussed our office protocol on injection teaching, etc. He will let us know if he like to pursue injections in the future   Return in about 6 months (around 01/09/2020) for PSA.  Hollice Espy, MD  [facility] [address], [place], [place] [address] [phone]

## 2019-07-11 NOTE — Telephone Encounter (Signed)
[facility] called for clarification for [employer], they would like a call back.

## 2019-07-12 ENCOUNTER — Encounter: Payer: Self-pay | Admitting: Urology

## 2019-07-12 NOTE — Telephone Encounter (Signed)
Spoke with Janett Billow at [facility] pharmacy-clarified instructions.

## 2019-12-20 ENCOUNTER — Encounter: Payer: Self-pay | Admitting: Urology

## 2019-12-20 ENCOUNTER — Ambulatory Visit (INDEPENDENT_AMBULATORY_CARE_PROVIDER_SITE_OTHER): Payer: Medicare Other | Admitting: Urology

## 2019-12-20 ENCOUNTER — Other Ambulatory Visit: Payer: Self-pay

## 2019-12-20 VITALS — BP 126/87 | HR 60 | Ht 76.0 in | Wt 235.0 lb

## 2019-12-20 DIAGNOSIS — C61 Malignant neoplasm of prostate: Secondary | ICD-10-CM

## 2019-12-20 DIAGNOSIS — N5231 Erectile dysfunction following radical prostatectomy: Secondary | ICD-10-CM

## 2019-12-20 NOTE — Progress Notes (Signed)
12/20/2019 1:14 PM   Justin Tyler [birth year] [id number]  Referring provider: Wayland Denis, PA-C [address] [place],  [place] [address]  Chief Complaint  Patient presents with  . Follow-up    HPI: Mr. Murillo is a 64 year old male with prostate cancer, incontinence and ED who presents today to discuss treatment options for ED.    Prostate cancer Intermediate risk prostate cancer status post robotic prostatectomy with bilateral pelvic lymph node dissection on 06/12/2019.   Surgical pathology shows focal extraprostatic extension with negative margins, negative lymph nodes, negative seminal vesicles and bladder neck.  pT3 aN0 MX.  PSA in 06/2019 <0.1 ng/dL.  Today's PSA is pending.  Incontinence Very rare occurrence of stress incontinence  Erectile dysfunction SHIM score: 5    Main complaint: No erections since surgery. Risk factors:  age, prostate cancer and anxiety No painful erections or curvatures with his erections.    No longer having spontaneous erections.  Tried:  Sildenafil without success, Vacuum Erect Aid device  He is mainly interested in keeping blood flow to his penis versus having the ability to have intercourse at this time as his wife has had a hysterectomy and both have lost some of their libido.     SHIM    Row Name 12/20/19 1055         SHIM: Over the last 6 months:   How do you rate your confidence that you could get and keep an erection?  Very Low     When you had erections with sexual stimulation, how often were your erections hard enough for penetration (entering your partner)?  Almost Never or Never     During sexual intercourse, how often were you able to maintain your erection after you had penetrated (entered) your partner?  Almost Never or Never     During sexual intercourse, how difficult was it to maintain your erection to completion of intercourse?  Extremely Difficult     When you attempted sexual intercourse, how  often was it satisfactory for you?  Almost Never or Never       SHIM Total Score   SHIM  5        Score: 1-7 Severe ED 8-11 Moderate ED 12-16 Mild-Moderate ED 17-21 Mild ED 22-25 No ED      PMH: Past Medical History:  Diagnosis Date  . Anxiety   . Arthritis   . Cancer ([place])   . Headache    h/o migraines  . Heart murmur    asymptomatic  . Hypertension     Surgical History: Past Surgical History:  Procedure Laterality Date  . JOINT REPLACEMENT    . PELVIC LYMPH NODE DISSECTION Bilateral 06/12/2019   Procedure: PELVIC LYMPH NODE DISSECTION;  Surgeon: Hollice Espy, MD;  Location: [facility] ORS;  Service: Urology;  Laterality: Bilateral;  . ROBOT ASSISTED LAPAROSCOPIC RADICAL PROSTATECTOMY N/A 06/12/2019   Procedure: XI ROBOTIC ASSISTED LAPAROSCOPIC RADICAL PROSTATECTOMY;  Surgeon: Hollice Espy, MD;  Location: [facility];  Service: Urology;  Laterality: N/A;  . TOTAL HIP ARTHROPLASTY Right   . TOTAL HIP ARTHROPLASTY Left 11/18/2017   Procedure: TOTAL HIP ARTHROPLASTY;  Surgeon: Corky Mull, MD;  Location: [facility];  Service: Orthopedics;  Laterality: Left;  . TOTAL HIP REVISION Right     Home Medications:  Allergies as of 12/20/2019   No Known Allergies     Medication List       Accurate as of December 20, 2019  1:14  PM. If you have any questions, ask your nurse or doctor.        amLODipine 5 MG tablet Commonly known as: NORVASC Take 5 mg by mouth every evening.   atorvastatin 10 MG tablet Commonly known as: LIPITOR Take 10 mg by mouth daily at 6 PM.   carvedilol 3.125 MG tablet Commonly known as: COREG Take 3.125 mg by mouth 2 (two) times daily with a meal.   docusate sodium 100 MG capsule Commonly known as: COLACE Take 1 capsule (100 mg total) by mouth 2 (two) times daily.   losartan 50 MG tablet Commonly known as: COZAAR Take 50 mg by mouth 2 (two) times daily.   meloxicam 15 MG tablet Commonly known as: MOBIC Take 15 mg by mouth daily.     oxyCODONE-acetaminophen 5-325 MG tablet Commonly known as: PERCOCET/ROXICET Take 1-2 tablets by mouth every 4 (four) hours as needed for moderate pain.   sildenafil 20 MG tablet Commonly known as: Revatio Take 1 tablet (20 mg total) by mouth as needed. Take 1-5 tabs as needed prior to intercourse   SUMAtriptan 100 MG tablet Commonly known as: IMITREX Take 100 mg by mouth every 2 (two) hours as needed for migraine. May repeat in 2 hours if headache persists or recurs.   Venlafaxine HCl 225 MG Tb24 Take 225 mg by mouth daily with breakfast.       Allergies: No Known Allergies  Family History: No family history on file.  Social History:  reports that he quit smoking about 19 years ago. His smoking use included cigarettes. He has a 20.00 pack-year smoking history. He has never used smokeless tobacco. He reports current alcohol use. He reports that he does not use drugs.  ROS: Pertinent ROS in HPI  Physical Exam: BP 126/87   Pulse 60   Ht 6\' 4"  (1.93 m)   Wt 235 lb (106.6 kg)   BMI 28.61 kg/m   Constitutional:  Well nourished. Alert and oriented, No acute distress. HEENT: [place] AT, mask in place.  Trachea midline, no masses. Cardiovascular: No clubbing, cyanosis, or edema. Respiratory: Normal respiratory effort, no increased work of breathing. Neurologic: Grossly intact, no focal deficits, moving all 4 extremities. Psychiatric: Normal mood and affect.  Laboratory Data: Lab Results  Component Value Date   WBC 14.6 (H) 06/13/2019   HGB 12.2 (L) 06/13/2019   HCT 36.0 (L) 06/13/2019   MCV 90.2 06/13/2019   PLT 201 06/13/2019    Lab Results  Component Value Date   CREATININE 0.62 06/13/2019    Urinalysis    Component Value Date/Time   COLORURINE YELLOW (A) 11/03/2017 1211   APPEARANCEUR Clear 06/01/2019 0958   LABSPEC 1.011 11/03/2017 1211   PHURINE 7.0 11/03/2017 1211   GLUCOSEU Negative 06/01/2019 0958   HGBUR NEGATIVE 11/03/2017 1211   BILIRUBINUR Negative  06/01/2019 [place] 11/03/2017 1211   PROTEINUR Trace (A) 06/01/2019 0958   PROTEINUR NEGATIVE 11/03/2017 1211   NITRITE Negative 06/01/2019 0958   NITRITE NEGATIVE 11/03/2017 1211   LEUKOCYTESUR Negative 06/01/2019 0958    I have reviewed the labs.   Pertinent Imaging: No recent imaging  Assessment & Plan:   1. Erectile dysfunction We discussed trying another PDE 5 inhibitor, but I expressed my reservations as it would likely not work as sildenafil was not successful.  We discussed the intraurethral pellets as well as the intracavernosal injections.  I explained that most men went on to use intracavernosal injections when PDE 5 inhibitors  were not effective as it is the least cost prohibitive between the intraurethral pellets and the injections.  I explained how an office titration would be performed.  I also mentioned the penile prothesis, but he is not interested in having surgery.  His main concern at this time is keeping his penile tissues healthy by promoting blood flow through the penis on a routine basis.  I explained that he can accomplish this using the vacuum erect aid device a couple times a week.    2. Prostate cancer - PSA - pending - has follow up appointment with Dr. Erlene Quan on 07/09/2020  Return for Keep follow up on 07/09/2020 with Dr. Erlene Quan .  These notes generated with voice recognition software. I apologize for typographical errors.  Zara Council, PA-C  [facility] [address]  [place], [place] [address] [phone]

## 2019-12-21 LAB — PSA: Prostate Specific Ag, Serum: <0.1 ng/mL (ref 0.0–4.0)

## 2020-01-04 ENCOUNTER — Other Ambulatory Visit: Payer: Medicare Other

## 2020-01-04 ENCOUNTER — Other Ambulatory Visit: Payer: Medicare PPO

## 2020-01-10 ENCOUNTER — Ambulatory Visit: Payer: Medicare PPO | Admitting: Urology

## 2020-01-24 ENCOUNTER — Other Ambulatory Visit: Payer: Self-pay | Admitting: Urology

## 2020-01-24 ENCOUNTER — Telehealth: Payer: Self-pay | Admitting: *Deleted

## 2020-01-24 NOTE — Telephone Encounter (Signed)
Pt calling stating that he has a lengthy discussion with you about a testosterone rx? Pt is calling stating he has all the symptoms of low testosterone (he didn't say what they where) please advise if rx for testosterone is ok?

## 2020-01-24 NOTE — Telephone Encounter (Signed)
I would suggest scheduling an appointment with Dr. Erlene Quan to discuss his concerns.

## 2020-01-24 NOTE — Telephone Encounter (Signed)
We did not discuss testosterone treatment, as it is contraindicated in folks with prostate cancer.  We did discuss ED.    From my note: We discussed trying another PDE 5 inhibitor, but I expressed my reservations as it would likely not work as sildenafil was not successful.  We discussed the intraurethral pellets as well as the intracavernosal injections.  I explained that most men went on to use intracavernosal injections when PDE 5 inhibitors were not effective as it is the least cost prohibitive between the intraurethral pellets and the injections.  I explained how an office titration would be performed.  I also mentioned the penile prothesis, but he is not interested in having surgery.  His main concern at this time is keeping his penile tissues healthy by promoting blood flow through the penis on a routine basis.  I explained that he can accomplish this using the vacuum erect aid device a couple times a week.    Viagra didn't work.  Is he wanting to try Cialis?  Or is he wanting to try the Trimix injections?

## 2020-01-24 NOTE — Telephone Encounter (Signed)
Dr. Erlene Quan, ok to schedule appt? Urgent or not?

## 2020-01-24 NOTE — Telephone Encounter (Signed)
Spoke with patient and he stated he was reading somewhere on the Internet that once you loose all your testosterone then everything is gone. Pt states he is having hair loss, loosing body tone, gaining fat, tired, sleepy and eat. Pt states if he is cancer free why can't he use testosterone? Pt is questioning his quality of life and how long before he's cancer free if we are worried about it?

## 2020-01-25 MED ORDER — TADALAFIL 20 MG PO TABS: 20.0000 mg | ORAL_TABLET | Freq: Every day | ORAL | 0 refills | Status: AC | PRN

## 2020-01-25 NOTE — Telephone Encounter (Signed)
No appt needed

## 2020-02-14 ENCOUNTER — Encounter: Payer: Self-pay | Admitting: Urology

## 2020-02-16 ENCOUNTER — Encounter: Payer: Self-pay | Admitting: Urology

## 2020-04-16 NOTE — Progress Notes (Signed)
04/17/2020 9:42 AM   Justin Tyler [birth year] [id number]  Referring provider: Wayland Denis, PA-C [address] [place],  [place] [address] Chief Complaint  Patient presents with  . Prostate Cancer    HPI: Justin Tyler is a 64 y.o. male with prostate cancer, incontinence and ED who presents today to discuss testosterone injections.   Prostate cancer Intermediate risk prostate cancer status post robotic prostatectomy with bilateral pelvic lymph node dissection on 06/12/2019. Surgical pathology shows focal extraprostatic extension with negative margins, negative lymph nodes, negative seminal vesicles and bladder neck. pT3 aN0 MX.  PSA in 06/2019 <0.1 ng/dL. PSA <0.1 as of 12/20/19.    Erectile dysfunction Main complaint: No erections since surgery. No painful erections or curvatures with his erections.  No longer having spontaneous erections with decrease libido. He also notes today that he has had decreased energy, some central abdominal weight gain as well as hair loss. He thinks his testosterone level may be low. Has not had this checked yet. He has been doing some reading about testosterone replacement therapy for patients following prostate cancer and understands that this could be a safe option. Tried:  Sildenafil without success, Vacuum Erect Aid device    PMH: Past Medical History:  Diagnosis Date  . Anxiety   . Arthritis   . Cancer ([place])   . Headache    h/o migraines  . Heart murmur    asymptomatic  . Hypertension     Surgical History: Past Surgical History:  Procedure Laterality Date  . JOINT REPLACEMENT    . PELVIC LYMPH NODE DISSECTION Bilateral 06/12/2019   Procedure: PELVIC LYMPH NODE DISSECTION;  Surgeon: Hollice Espy, MD;  Location: [facility];  Service: Urology;  Laterality: Bilateral;  . ROBOT ASSISTED LAPAROSCOPIC RADICAL PROSTATECTOMY N/A 06/12/2019   Procedure: XI ROBOTIC ASSISTED LAPAROSCOPIC RADICAL PROSTATECTOMY;  Surgeon:  Hollice Espy, MD;  Location: [facility];  Service: Urology;  Laterality: N/A;  . TOTAL HIP ARTHROPLASTY Right   . TOTAL HIP ARTHROPLASTY Left 11/18/2017   Procedure: TOTAL HIP ARTHROPLASTY;  Surgeon: Corky Mull, MD;  Location: [facility];  Service: Orthopedics;  Laterality: Left;  . TOTAL HIP REVISION Right     Home Medications:  Allergies as of 04/17/2020   No Known Allergies     Medication List       Accurate as of April 17, 2020  9:42 AM. If you have any questions, ask your nurse or doctor.        amLODipine 5 MG tablet Commonly known as: NORVASC Take 5 mg by mouth every evening.   atorvastatin 10 MG tablet Commonly known as: LIPITOR Take 10 mg by mouth daily at 6 PM.   busPIRone 7.5 MG tablet Commonly known as: BUSPAR Take by mouth.   carvedilol 3.125 MG tablet Commonly known as: COREG Take 3.125 mg by mouth 2 (two) times daily with a meal.   docusate sodium 100 MG capsule Commonly known as: COLACE Take 1 capsule (100 mg total) by mouth 2 (two) times daily.   losartan 50 MG tablet Commonly known as: COZAAR Take 50 mg by mouth 2 (two) times daily.   meloxicam 15 MG tablet Commonly known as: MOBIC Take 15 mg by mouth daily.   oxyCODONE-acetaminophen 5-325 MG tablet Commonly known as: PERCOCET/ROXICET Take 1-2 tablets by mouth every 4 (four) hours as needed for moderate pain.   SUMAtriptan 100 MG tablet Commonly known as: IMITREX Take 100 mg by mouth every 2 (two) hours as needed  for migraine. May repeat in 2 hours if headache persists or recurs.   tadalafil 20 MG tablet Commonly known as: Cialis Take 1 tablet (20 mg total) by mouth daily as needed for erectile dysfunction.   Venlafaxine HCl 225 MG Tb24 Take 225 mg by mouth daily with breakfast.   venlafaxine XR 75 MG 24 hr capsule Commonly known as: EFFEXOR-XR       Allergies: No Known Allergies  Family History: No family history on file.  Social History:  reports that he quit smoking about 19  years ago. His smoking use included cigarettes. He has a 20.00 pack-year smoking history. He has never used smokeless tobacco. He reports current alcohol use. He reports that he does not use drugs.   Physical Exam: BP (!) 151/93   Pulse 62   Ht 6\' 4"  (1.93 m)   Wt 252 lb (114.3 kg)   BMI 30.67 kg/m   Constitutional:  Alert and oriented, No acute distress. HEENT: [place] AT, moist mucus membranes.  Trachea midline, no masses. Cardiovascular: No clubbing, cyanosis, or edema. Respiratory: Normal respiratory effort, no increased work of breathing. Skin: No rashes, bruises or suspicious lesions. Neurologic: Grossly intact, no focal deficits, moving all 4 extremities. Psychiatric: Normal mood and affect.   Assessment & Plan:    1. Prostate cancer  No evidence of disease, PSA undetectable We will continue to follow PSA on a every 6 month basis Most recent PSA was <0.1 on 12/20/2019.   3. 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  That being said, he does have some other symptoms of hypogonadism as listed above and as such, is reasonable to go ahead and rule this out as a underlying factor.  We will plan to check his testosterone tomorrow. If his testosterone is low, he will need it checked a second time as well as additional hormonal evaluation to rule out any additional issues. He is agreeable this plan.  If he is diagnosed with hypogonadism, left have a frank discussion about risk and benefits of supplementation.  [facility] [address], [place], [place] [address] [phone]  I, Selena Batten, am acting as a scribe for Dr. Hollice Espy.  I have reviewed the above documentation for accuracy and completeness, and I agree with the above.   Hollice Espy, MD

## 2020-04-17 ENCOUNTER — Other Ambulatory Visit: Payer: Self-pay

## 2020-04-17 ENCOUNTER — Ambulatory Visit (INDEPENDENT_AMBULATORY_CARE_PROVIDER_SITE_OTHER): Payer: Medicare Other | Admitting: Urology

## 2020-04-17 VITALS — BP 151/93 | HR 62 | Ht 76.0 in | Wt 252.0 lb

## 2020-04-17 DIAGNOSIS — N5231 Erectile dysfunction following radical prostatectomy: Secondary | ICD-10-CM | POA: Diagnosis not present

## 2020-04-18 ENCOUNTER — Other Ambulatory Visit: Payer: Medicare Other

## 2020-04-18 ENCOUNTER — Other Ambulatory Visit: Payer: Self-pay

## 2020-04-18 DIAGNOSIS — N5231 Erectile dysfunction following radical prostatectomy: Secondary | ICD-10-CM

## 2020-04-19 LAB — TESTOSTERONE: Testosterone: 184 ng/dL — ABNORMAL LOW (ref 264–916)

## 2020-04-22 ENCOUNTER — Telehealth: Payer: Self-pay

## 2020-04-22 DIAGNOSIS — R5383 Other fatigue: Secondary | ICD-10-CM

## 2020-04-22 DIAGNOSIS — R6882 Decreased libido: Secondary | ICD-10-CM

## 2020-04-22 DIAGNOSIS — R7989 Other specified abnormal findings of blood chemistry: Secondary | ICD-10-CM

## 2020-04-22 NOTE — Telephone Encounter (Signed)
mychart notification sent 

## 2020-04-22 NOTE — Telephone Encounter (Signed)
-----   Message from Hollice Espy, MD sent at 04/19/2020  3:17 PM EDT ----- Testosterone levels are in fact low.  I would like him to come in for repeat lab including testosterone, FSH, LH, TSH, prolactin, and estradiol.  Please schedule an appointment with me after he has these labs done to discuss options.  Hollice Espy, MD

## 2020-04-22 NOTE — Telephone Encounter (Signed)
Patient notified, orders placed.

## 2020-04-23 NOTE — Addendum Note (Signed)
Addended by: Tommy Rainwater on: 04/23/2020 09:56 AM   Modules accepted: Orders

## 2020-04-29 ENCOUNTER — Other Ambulatory Visit: Payer: Medicare Other

## 2020-04-29 ENCOUNTER — Other Ambulatory Visit: Payer: Self-pay

## 2020-04-29 DIAGNOSIS — R7989 Other specified abnormal findings of blood chemistry: Secondary | ICD-10-CM

## 2020-04-29 DIAGNOSIS — R5383 Other fatigue: Secondary | ICD-10-CM

## 2020-04-29 DIAGNOSIS — C61 Malignant neoplasm of prostate: Secondary | ICD-10-CM

## 2020-04-30 LAB — ESTRADIOL: Estradiol: 18.8 pg/mL (ref 7.6–42.6)

## 2020-04-30 LAB — TESTOSTERONE: Testosterone: 268 ng/dL (ref 264–916)

## 2020-04-30 LAB — FSH/LH
FSH: 2.9 m[IU]/mL (ref 1.5–12.4)
LH: 7.4 m[IU]/mL (ref 1.7–8.6)

## 2020-04-30 LAB — TSH: TSH: 0.349 u[IU]/mL — ABNORMAL LOW (ref 0.450–4.500)

## 2020-04-30 LAB — PSA: Prostate Specific Ag, Serum: <0.1 ng/mL (ref 0.0–4.0)

## 2020-04-30 LAB — PROLACTIN: Prolactin: 10 ng/mL (ref 4.0–15.2)

## 2020-05-06 NOTE — Progress Notes (Signed)
05/07/2020  5:15 PM   Daine Floras Nov 29, 1955 [id number]  Referring provider: Wayland Denis, PA-C [address] [place],  [place] [address] Chief Complaint  Patient presents with  . Hypogonadism  . Erectile Dysfunction    HPI: Justin Tyler is a 64 y.o. 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 He is accompanied by his wife.   Prostate cancer Intermediate risk prostate cancer status post robotic prostatectomy with bilateral pelvic lymph node dissection on 06/12/2019. Surgical pathology shows focal extraprostatic extension with negative margins, negative lymph nodes, negative seminal vesicles and bladder neck. pT3 aN0 MX.PSA is <0.1 as of 04/29/2020.   PSA trend: Component     Latest Ref Rng & Units 10/04/2018 07/07/2019 12/20/2019 04/29/2020  Prostate Specific Ag, Serum     0.0 - 4.0 ng/mL 5.7 (H) <0.1 <0.1 <0.1    Erectile dysfunction/hypogonadism  Main complaint:No erections since surgery. He feels extremely tired especially when getting up in the mornings. He has not had an erection in 6 months. The patient is very concerned about losing penile elasticity.  Tried:Sildenafil without success, Vacuum Erect Aid device  Labs from 04/29/2020 revealed testosterone 268 (borderline low), TSH 0.349 (mildly low), estradiol 18.8, prolactin 10.0, FSH 2.9, LH 7.4.   Previous testosterone  184 04/18/2020.   PMH: Past Medical History:  Diagnosis Date  . Anxiety   . Arthritis   . Cancer ([place])   . Headache    h/o migraines  . Heart murmur    asymptomatic  . Hypertension     Surgical History: Past Surgical History:  Procedure Laterality Date  . JOINT REPLACEMENT    . PELVIC LYMPH NODE DISSECTION Bilateral 06/12/2019   Procedure: PELVIC LYMPH NODE DISSECTION;  Surgeon: Hollice Espy, MD;  Location: [facility];  Service: Urology;  Laterality: Bilateral;  . ROBOT ASSISTED LAPAROSCOPIC RADICAL  PROSTATECTOMY N/A 06/12/2019   Procedure: XI ROBOTIC ASSISTED LAPAROSCOPIC RADICAL PROSTATECTOMY;  Surgeon: Hollice Espy, MD;  Location: [facility];  Service: Urology;  Laterality: N/A;  . TOTAL HIP ARTHROPLASTY Right   . TOTAL HIP ARTHROPLASTY Left 11/18/2017   Procedure: TOTAL HIP ARTHROPLASTY;  Surgeon: Corky Mull, MD;  Location: [facility];  Service: Orthopedics;  Laterality: Left;  . TOTAL HIP REVISION Right     Home Medications:  Allergies as of 05/07/2020   No Known Allergies     Medication List       Accurate as of May 07, 2020 11:59 PM. If you have any questions, ask your nurse or doctor.        STOP taking these medications   docusate sodium 100 MG capsule Commonly known as: COLACE Stopped by: Hollice Espy, MD   oxyCODONE-acetaminophen 5-325 MG tablet Commonly known as: PERCOCET/ROXICET Stopped by: Hollice Espy, MD     TAKE these medications   amLODipine 5 MG tablet Commonly known as: NORVASC Take 5 mg by mouth every evening.   atorvastatin 10 MG tablet Commonly known as: LIPITOR Take 10 mg by mouth daily at 6 PM.   busPIRone 7.5 MG tablet Commonly known as: BUSPAR Take by mouth.   carvedilol 3.125 MG tablet Commonly known as: COREG Take 3.125 mg by mouth 2 (two) times daily with a meal.   clomiPHENE 50 MG tablet Commonly known as: CLOMID Take 0.5 tablets (25 mg total) by mouth daily. Started by: Hollice Espy, MD   losartan 50 MG tablet Commonly known as: COZAAR Take 50 mg by mouth  2 (two) times daily.   meloxicam 15 MG tablet Commonly known as: MOBIC Take 15 mg by mouth daily.   SUMAtriptan 100 MG tablet Commonly known as: IMITREX Take 100 mg by mouth every 2 (two) hours as needed for migraine. May repeat in 2 hours if headache persists or recurs.   tadalafil 20 MG tablet Commonly known as: Cialis Take 1 tablet (20 mg total) by mouth daily as needed for erectile dysfunction.   Venlafaxine HCl 225 MG Tb24 Take 225 mg by mouth daily  with breakfast.   venlafaxine XR 75 MG 24 hr capsule Commonly known as: EFFEXOR-XR       Allergies: No Known Allergies  Family History: No family history on file.  Social History:  reports that he quit smoking about 19 years ago. His smoking use included cigarettes. He has a 20.00 pack-year smoking history. He has never used smokeless tobacco. He reports current alcohol use. He reports that he does not use drugs.   Physical Exam: BP 124/83   Pulse 84   Ht 6\' 4"  (1.93 m)   Wt 246 lb (111.6 kg)   BMI 29.94 kg/m   Constitutional:  Alert and oriented, No acute distress. HEENT: [place] AT, moist mucus membranes.  Trachea midline, no masses. Cardiovascular: No clubbing, cyanosis, or edema. Respiratory: Normal respiratory effort, no increased work of breathing. Skin: No rashes, bruises or suspicious lesions. Neurologic: Grossly intact, no focal deficits, moving all 4 extremities. Psychiatric: Normal mood and affect.  Laboratory Data:  Lab Results  Component Value Date   TESTOSTERONE 268 04/29/2020    Assessment & Plan:    1. Prostate cancer NED, PSA undetectable.  We will continue to follow PSA on a every 6 month basis  2. 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 Discussed risk and benefits of testosterone supplementation for hypogonadism and specifically as they relate to risk of prostate cancer.  We reviewed the literature regarding testosterone use in post prostatectomy patients.  We discussed that there is a risk-benefit calculation but overall, the data appears that this is relatively safe to do. At this point time, he is pursuing a sleep study as he believes that some of his fatigue may be related to this.  I encouraged him to follow through with this prior to starting any sort of testosterone replacement to make sure that this is not the underlying issue.  We can certainly treat his erectile  dysfunction without treating his hypogonadism as previously discussed. We discussed alternative options to Depo testosterone which could include augmentation with Clomid which achieves slightly lower testosterone levels without as much swing and peaks and troughs.  He understands at this is off label use. He may be interested in pursuing Clomid.  Again as above, would like him to hold off on starting this medication until after his sleep study and implementation of CPAP.  We will have him take 25 mg daily and recheck his testosterone levels as well as PSA very closely.   PCP Wayland Denis, PA-C will be notified about low TSH.  This is only borderline abnormal and likely is not contributing to his symptoms.  I will defer treatment or further work-up to his PCP.  -Clomid 25 mg Rx sent.    Return in about 3 months (around 08/07/2020) for 62mo. with labs  [facility] [address], [place], [place] [address] [phone]  I, Selena Batten, am acting  as a scribe for Dr. Hollice Espy.  I have reviewed the above documentation for accuracy and completeness, and I agree with the above.   Hollice Espy, MD  I spent 40 total minutes on the day of the encounter including pre-visit review of the medical record, face-to-face time with the patient, and post visit ordering of labs/imaging/tests.

## 2020-05-07 ENCOUNTER — Other Ambulatory Visit: Payer: Self-pay

## 2020-05-07 ENCOUNTER — Ambulatory Visit (INDEPENDENT_AMBULATORY_CARE_PROVIDER_SITE_OTHER): Payer: Medicare Other | Admitting: Urology

## 2020-05-07 ENCOUNTER — Encounter: Payer: Self-pay | Admitting: Urology

## 2020-05-07 VITALS — BP 124/83 | HR 84 | Ht 76.0 in | Wt 246.0 lb

## 2020-05-07 DIAGNOSIS — N401 Enlarged prostate with lower urinary tract symptoms: Secondary | ICD-10-CM

## 2020-05-07 DIAGNOSIS — N138 Other obstructive and reflux uropathy: Secondary | ICD-10-CM | POA: Diagnosis not present

## 2020-05-07 DIAGNOSIS — R7989 Other specified abnormal findings of blood chemistry: Secondary | ICD-10-CM | POA: Diagnosis not present

## 2020-05-07 MED ORDER — CLOMIPHENE CITRATE 50 MG PO TABS: 25.0000 mg | ORAL_TABLET | Freq: Every day | ORAL | 3 refills | Status: AC

## 2020-05-15 ENCOUNTER — Encounter: Payer: Self-pay | Admitting: Urology

## 2020-07-09 ENCOUNTER — Ambulatory Visit: Payer: Self-pay | Admitting: Urology

## 2020-08-01 ENCOUNTER — Telehealth: Payer: Self-pay | Admitting: Urology

## 2020-08-01 NOTE — Telephone Encounter (Signed)
Justin Tyler has prostate cancer and has cancelled his follow up labs and office visit with Dr. Erlene Quan.  Would you call him and get him rescheduled or see if he has transferred his care to another urologist?

## 2020-08-02 ENCOUNTER — Other Ambulatory Visit: Payer: Self-pay

## 2020-08-07 ENCOUNTER — Ambulatory Visit: Payer: Self-pay | Admitting: Urology

## 2020-08-20 ENCOUNTER — Ambulatory Visit: Payer: Self-pay | Admitting: Urology

## 2020-09-13 ENCOUNTER — Encounter: Payer: Self-pay | Admitting: Urology

## 2020-09-13 DIAGNOSIS — C61 Malignant neoplasm of prostate: Secondary | ICD-10-CM

## 2020-10-10 ENCOUNTER — Other Ambulatory Visit: Payer: Self-pay | Admitting: *Deleted

## 2020-10-10 DIAGNOSIS — C61 Malignant neoplasm of prostate: Secondary | ICD-10-CM

## 2020-10-10 NOTE — Telephone Encounter (Signed)
Spoke to patient-preferred PSA to be checked at [facility] and faxed to number provided   [facility] in [place], [place].  Phone:  [phone]       Fax:  [phone]  Will send test results and be notified

## 2020-10-18 ENCOUNTER — Telehealth: Payer: Self-pay | Admitting: *Deleted

## 2020-10-18 LAB — PSA: Prostate Specific Ag, Serum: <0.1 ng/mL (ref 0.0–4.0)

## 2020-10-18 NOTE — Telephone Encounter (Signed)
Pt returned call on triage line. Pt informed of PSA results. Pt voiced understanding.

## 2020-10-18 NOTE — Telephone Encounter (Addendum)
Left patient a VM with details, asked to return call with any questions.   ----- Message from Hollice Espy, MD sent at 10/18/2020  1:45 PM EST ----- PSA remains undetectable  Hollice Espy, MD

## 2021-04-18 ENCOUNTER — Telehealth: Payer: Medicare Other

## 2021-04-18 DIAGNOSIS — C61 Malignant neoplasm of prostate: Secondary | ICD-10-CM

## 2021-04-18 NOTE — Telephone Encounter (Signed)
Patient called stating he had one episode of blood in his urine a few days ago. He denies any symptoms at this point. He is wondering when he needs to get a repeat psa. Patient has canceled his last few appointments. I asked him to schedule an appointment which he declined. He would rather see if you would just order a PSA as he lives about 3 hours away he stated.

## 2021-04-18 NOTE — Telephone Encounter (Signed)
He needs to be seen and evaluated for the blood in his urine.  This is not normal.  Additionally, he should be having a PSA at least once a year at minimum.    If you would like, we could arrange a virtual visit but eventually with blood in his urine, he will need to have a cystoscopy.  That require an in person visit.  Hollice Espy, MD

## 2021-04-21 NOTE — Telephone Encounter (Signed)
Patient informed, voiced understanding. Scheduled cystoscopy-he is aware to have PSA drawn on same day.

## 2021-04-29 NOTE — Progress Notes (Signed)
   04/30/2021  CC:  Chief Complaint  Patient presents with   Cysto    HPI: Justin Tyler is a 65 y.o. male ho presents today for a cystoscopy.   On 04/18/2021 he had a telephone encounter stating he had an isolated episode of blood in his urine during the beginning of his stream of urine.    Intermediate risk prostate cancer status post robotic prostatectomy with bilateral pelvic lymph node dissection on 06/12/2019. Surgical pathology shows focal extraprostatic extension with negative margins, negative lymph nodes, negative seminal vesicles and bladder neck.  pT3 aN0 MX.  He also has ED, he has tried sildenafl without success, vacuum erect aid divide.   Labs from 04/29/2020 revealed testosterone 268 (borderline low), TSH 0.349 (mildly low), estradiol 18.8, prolactin 10.0, FSH 2.9, LH 7.4.   Previous testosterone  184 04/18/2020.  Patient is accompanies by his wife.   Vitals:   04/30/21 1409  BP: 119/73  Pulse: 89  NED. A&Ox3.   No respiratory distress   Abd soft, NT, ND Normal phallus with bilateral descended testicles  Cystoscopy Procedure Note  Patient identification was confirmed, informed consent was obtained, and patient was prepped using Betadine solution.  Lidocaine jelly was administered per urethral meatus.     Pre-Procedure: - Inspection reveals a normal caliber ureteral meatus.  Procedure: The flexible cystoscope was introduced without difficulty - No urethral strictures/lesions are present. - Surgically absent prostate with a max of open anastomosis - Normal bladder neck - Bilateral ureteral orifices identified - Bladder mucosa  reveals no ulcers or lesions - No bladder stones - No trabeculation  Retroflexion shows small 0.5 cm area to the left of the left UO which is broad base with a textured appearance possibly representing a superficial low grade tumor, benign papilloma ,versus focal cystitis    Post-Procedure: - Patient tolerated the procedure  well  Assessment/ Plan:  History of prostate cancer  -PSA drawn today   Hematuria  - Cystoscopy today showed small 0.5 cm area to the left of the left UO which is broad base with a textured appearance possibly representing a superficial low grade tumor, benign papilloma versus focal cystitis  - Recommend prostate biopsy  - Risk of prostate biopsy include risk of blood in urine, stool, and ejaculate, serious infection, and discomfort.  - RUS to evaluate upper tracts scheduled in [place], [place]  - Urine sent for urine culture   Follow-up for prostate biopsy   I,Kailey Littlejohn,acting as a scribe for Hollice Espy, MD.,have documented all relevant documentation on the behalf of Hollice Espy, MD,as directed by  Hollice Espy, MD while in the presence of Hollice Espy, MD.  I have reviewed the above documentation for accuracy and completeness, and I agree with the above.   Hollice Espy, MD

## 2021-04-30 ENCOUNTER — Encounter: Payer: Self-pay | Admitting: Urology

## 2021-04-30 ENCOUNTER — Ambulatory Visit (INDEPENDENT_AMBULATORY_CARE_PROVIDER_SITE_OTHER): Payer: Medicare Other | Admitting: Urology

## 2021-04-30 ENCOUNTER — Other Ambulatory Visit: Payer: Self-pay

## 2021-04-30 VITALS — BP 119/73 | HR 89 | Ht 76.0 in | Wt 242.0 lb

## 2021-04-30 DIAGNOSIS — C61 Malignant neoplasm of prostate: Secondary | ICD-10-CM

## 2021-04-30 DIAGNOSIS — R310 Gross hematuria: Secondary | ICD-10-CM | POA: Diagnosis not present

## 2021-05-01 LAB — URINALYSIS, COMPLETE
Bilirubin, UA: NEGATIVE
Glucose, UA: NEGATIVE
Ketones, UA: NEGATIVE
Leukocytes,UA: NEGATIVE
Nitrite, UA: NEGATIVE
Protein,UA: NEGATIVE
RBC, UA: NEGATIVE
Specific Gravity, UA: 1.015 (ref 1.005–1.030)
Urobilinogen, Ur: 0.2 mg/dL (ref 0.2–1.0)
pH, UA: 7 (ref 5.0–7.5)

## 2021-05-01 LAB — MICROSCOPIC EXAMINATION
Bacteria, UA: NONE SEEN
Epithelial Cells (non renal): NONE SEEN /hpf (ref 0–10)
RBC, Urine: NONE SEEN /hpf (ref 0–2)

## 2021-05-01 LAB — PSA: Prostate Specific Ag, Serum: <0.1 ng/mL (ref 0.0–4.0)

## 2021-05-02 LAB — CULTURE, URINE COMPREHENSIVE

## 2021-05-05 NOTE — Telephone Encounter (Signed)
Referral was faxed to [phone] Phone-[phone]    Sharyn Lull

## 2021-06-02 ENCOUNTER — Ambulatory Visit: Admit: 2021-06-02 | Payer: 59 | Admitting: Urology

## 2021-06-02 SURGERY — CYSTOSCOPY, WITH BIOPSY
Anesthesia: Choice
